# Patient Record
Sex: Female | Born: 1960
Health system: Southern US, Community
[De-identification: ages and names within clinical notes are randomized; demographics above are authoritative.]

## PROBLEM LIST (undated history)

## (undated) DIAGNOSIS — B019 Varicella without complication: Secondary | ICD-10-CM

## (undated) DIAGNOSIS — J45990 Exercise induced bronchospasm: Secondary | ICD-10-CM

## (undated) DIAGNOSIS — J45909 Unspecified asthma, uncomplicated: Secondary | ICD-10-CM

## (undated) HISTORY — DX: Varicella without complication: B01.9

## (undated) HISTORY — PX: CHOLECYSTECTOMY: SHX55

## (undated) HISTORY — DX: Exercise induced bronchospasm: J45.990

## (undated) HISTORY — DX: Unspecified asthma, uncomplicated: J45.909

---

## 1982-10-17 HISTORY — PX: WISDOM TOOTH EXTRACTION: SHX21

## 1994-10-17 HISTORY — PX: CHOLECYSTECTOMY: SHX55

## 2011-08-20 ENCOUNTER — Encounter: Payer: Self-pay | Admitting: Emergency Medicine

## 2011-08-20 ENCOUNTER — Emergency Department (HOSPITAL_BASED_OUTPATIENT_CLINIC_OR_DEPARTMENT_OTHER)
Admission: EM | Admit: 2011-08-20 | Discharge: 2011-08-21 | Disposition: A | Discharge status: Home / Self Care | Payer: BC Managed Care – PPO | Attending: Emergency Medicine | Admitting: Emergency Medicine

## 2011-08-20 DIAGNOSIS — R21 Rash and other nonspecific skin eruption: Secondary | ICD-10-CM | POA: Insufficient documentation

## 2011-08-20 DIAGNOSIS — R55 Syncope and collapse: Secondary | ICD-10-CM

## 2011-08-20 DIAGNOSIS — R51 Headache: Secondary | ICD-10-CM | POA: Insufficient documentation

## 2011-08-20 MED ORDER — SODIUM CHLORIDE 0.9 % IV BOLUS (SEPSIS)
1000.0000 mL | Freq: Once | INTRAVENOUS | Status: AC
  Administered 2011-08-21: 1000 mL via INTRAVENOUS

## 2011-08-20 MED ORDER — METHYLPREDNISOLONE SODIUM SUCC 125 MG IJ SOLR
125.0000 mg | Freq: Once | INTRAMUSCULAR | Status: AC
  Administered 2011-08-21: 125 mg via INTRAVENOUS
  Filled 2011-08-20: qty 2

## 2011-08-20 MED ORDER — DIPHENHYDRAMINE HCL 50 MG/ML IJ SOLN
25.0000 mg | Freq: Once | INTRAMUSCULAR | Status: AC
  Administered 2011-08-21: 25 mg via INTRAVENOUS
  Filled 2011-08-20: qty 1

## 2011-08-20 MED ORDER — FAMOTIDINE IN NACL 20-0.9 MG/50ML-% IV SOLN
20.0000 mg | Freq: Once | INTRAVENOUS | Status: AC
  Administered 2011-08-21: 20 mg via INTRAVENOUS
  Filled 2011-08-20: qty 50

## 2011-08-20 NOTE — ED Notes (Signed)
ECG done and given to EDP 

## 2011-08-20 NOTE — ED Notes (Signed)
Pt to ED via EMS w c/o syncopal episode per husband - pt reports she had tuna around 6:30 p & noticed BUE, trunk & back became "red"- went to bed, then became syncopal when she tried to get up.

## 2011-08-20 NOTE — ED Notes (Signed)
ZOFRAN 4MG  IV & BENADRYL 25 MG PO GIVEN BY EMS

## 2011-08-20 NOTE — ED Provider Notes (Signed)
Scribed for Forbes Cellar, MD, the patient was seen in room MH10/MH10. This chart was scribed by [employer]. The patient's care started at 23:04  CSN: [id number] Arrival date & time: 08/20/2011 10:26 PM   First MD Initiated Contact with Patient 08/20/11 2304      Chief Complaint  Patient presents with  . Loss of Consciousness   HPI Rachael Nelson is a 50 y.o. female brought in by ambulance who presents to the Emergency Department complaining of Loss of Consciousness. Patient reports that she had bright red blotches on her skin while having a tuna steak tonight at 6:30pm. She states "I didn't feel right". She went home immediately and felt very dizzy/light headed and began to have sudden onset diffuse headache. Patient reports that she tried standing up from bed, felt short of breath, felt chills, sat back down then passed out. Husband reports 1-2 min LOC. No shaking or tongue biting/incontinence. Husbands states that she was pale at time of syncope but that before and since that time he has notice progressively worsening skin redness. Denies neck stiffness.  Patient states that she is not currently short of breath, but reports "heavy palpitations in her heart" that feels like her heart is pounding. She did have these sx during EKG taken today. She continues to c/o diffuse headache. Patient denies itching.  Patient is a non-smoker and does not use alcohol. She reports current chills and mild nausea. Denies fever. Patient was given some Benadryl by EMS enroute the ER. Denies a history of cancer, her father has VTE but had known inciting factor (surgery), pt denies recent trip/travel/surg/hosp. No h/o seafood allergies  History reviewed. No pertinent past medical history.  Past Surgical History  Procedure Date  . Cesarean section   . Cholecystectomy     No family history on file.  History  Substance Use Topics  . Smoking status: Never Smoker   . Smokeless tobacco: Not on file  . Alcohol  Use: No    OB History    Grav Para Term Preterm Abortions TAB SAB Ect Mult Living                  Review of Systems  Respiratory: Positive for shortness of breath (currently resolved).   Skin: Positive for rash.  Neurological: Positive for syncope.  All other systems reviewed and are negative.    Allergies  Review of patient's allergies indicates no known allergies.  Home Medications   Current Outpatient Rx  Name Route Sig Dispense Refill  . CALCIUM + D PO Oral Take 1 tablet by mouth daily.      . OSTEO BI-FLEX ADV TRIPLE ST PO TABS Oral Take 1 tablet by mouth daily.      Carma Leaven M PLUS PO TABS Oral Take 1 tablet by mouth daily.      Marland Kitchen VITAMIN D (CHOLECALCIFEROL) PO Oral Take 1 tablet by mouth daily.        BP 108/67  Pulse 67  Temp 97.8 F (36.6 C)  Resp 18  SpO2 96%  LMP 08/08/2011  Physical Exam  Nursing note and vitals reviewed. Constitutional: She is oriented to person, place, and time. She appears well-developed and well-nourished. No distress.  HENT:  Head: Normocephalic and atraumatic.  Mouth/Throat: Oropharynx is clear and moist. No oropharyngeal exudate.  Eyes: Conjunctivae are normal. Right eye exhibits no discharge. Left eye exhibits no discharge.  Neck: Neck supple. No tracheal deviation present.       No meningismus  Cardiovascular: Normal rate.   No murmur heard. Pulmonary/Chest: Effort normal. No respiratory distress. She has no wheezes. She has no rales. She exhibits no tenderness.  Abdominal: Soft. There is no tenderness.  Musculoskeletal: Normal range of motion. She exhibits no edema and no tenderness.  Neurological: She is alert and oriented to person, place, and time. No cranial nerve deficit.  Skin: Skin is warm and dry. Rash (Diffuse macular rash that blanches with palpation. Rash is erythematous. Rash is evident  diffusely on her abdomen,  thoracic area, face and lumbar back) noted. She is not diaphoretic.  Psychiatric: She has a normal  mood and affect. Her behavior is normal.    ED Course  Procedures DIAGNOSTIC STUDIES: Oxygen Saturation is 96% on room air, normal by my interpretation.    COORDINATION OF CARE: 24:15 - EDP examined patient at bedside and ordered the following Orders Placed This Encounter  Procedures  . CT Head Wo Contrast  . CBC  . Differential  . Comprehensive metabolic panel  . Cardiac panel(cret kin+cktot+mb+tropi)  . D-dimer, quantitative     Date: 08/21/2011  Rate: 72  Rhythm: normal sinus rhythm  QRS Axis: normal  Intervals: normal  ST/T Wave abnormalities: normal  Conduction Disutrbances:none  Narrative Interpretation:   Old EKG Reviewed: none available    MDM: 50 year-old female previously healthy presents with multiple complaints including syncope, rash, headache after eating tuna. DDx incl scobroid (urticarial appearing rash but not pruritic), PE, SAH, vasovagal syncope. Labs unremarkable. Rash resolved with IVF, benadryl, decadron, pepcid. Pt did continue to c/o headache and CT head was subsequently ordered.   Scribe Attestation I personally performed the services described in this documentation, which was scribed in my presence. The recorded information has been reviewed and considered.  4:08 AM  Discussed results of CT head with patient. She is feeling completely back to baseline now. Headache 0/10. She is declining lumbar puncture. She is aware that resolution of symptoms after morphine does not indicate that [facility] is not present. Discussed extensively risks of leaving without LP and she is aware and still refusing. Will not sign out ama. Pt aware that she can return at any time.  Stefano Gaul, MD   Forbes Cellar, MD 08/21/11 [phone]

## 2011-08-21 ENCOUNTER — Emergency Department (INDEPENDENT_AMBULATORY_CARE_PROVIDER_SITE_OTHER): Payer: BC Managed Care – PPO

## 2011-08-21 DIAGNOSIS — R55 Syncope and collapse: Secondary | ICD-10-CM

## 2011-08-21 DIAGNOSIS — G319 Degenerative disease of nervous system, unspecified: Secondary | ICD-10-CM

## 2011-08-21 DIAGNOSIS — R51 Headache: Secondary | ICD-10-CM

## 2011-08-21 LAB — DIFFERENTIAL
Basophils Relative: 0 % (ref 0–1)
Eosinophils Absolute: 0 10*3/uL (ref 0.0–0.7)
Monocytes Relative: 4 % (ref 3–12)
Neutrophils Relative %: 85 % — ABNORMAL HIGH (ref 43–77)

## 2011-08-21 LAB — COMPREHENSIVE METABOLIC PANEL
Albumin: 3.6 g/dL (ref 3.5–5.2)
Alkaline Phosphatase: 62 U/L (ref 39–117)
BUN: 21 mg/dL (ref 6–23)
Potassium: 4 mEq/L (ref 3.5–5.1)
Sodium: 138 mEq/L (ref 135–145)
Total Protein: 6.8 g/dL (ref 6.0–8.3)

## 2011-08-21 LAB — CARDIAC PANEL(CRET KIN+CKTOT+MB+TROPI)
Relative Index: INVALID (ref 0.0–2.5)
Total CK: 55 U/L (ref 7–177)

## 2011-08-21 LAB — D-DIMER, QUANTITATIVE: D-Dimer, Quant: <0.22 ug/mL-FEU (ref 0.00–0.48)

## 2011-08-21 LAB — CBC
MCH: 29.6 pg (ref 26.0–34.0)
MCHC: 34.8 g/dL (ref 30.0–36.0)
Platelets: 293 10*3/uL (ref 150–400)
RDW: 12.6 % (ref 11.5–15.5)

## 2011-08-21 MED ORDER — MORPHINE SULFATE 4 MG/ML IJ SOLN
INTRAMUSCULAR | Status: AC
  Administered 2011-08-21: 4 mg via INTRAVENOUS
  Filled 2011-08-21: qty 1

## 2011-08-21 NOTE — ED Notes (Signed)
Pt given d/c instructions- caox4. States headache is gone, rates pain 0/10. No rx given. Husband present to drive

## 2011-08-21 NOTE — ED Notes (Signed)
Pt alert and orieted- states pain relieved after morphine administration. Has called her husband for ride home pending d/c paperwork

## 2014-10-17 HISTORY — PX: BREAST BIOPSY: SHX20

## 2015-09-23 ENCOUNTER — Other Ambulatory Visit: Payer: Self-pay | Admitting: Obstetrics and Gynecology

## 2015-09-23 DIAGNOSIS — R928 Other abnormal and inconclusive findings on diagnostic imaging of breast: Secondary | ICD-10-CM

## 2015-09-29 ENCOUNTER — Other Ambulatory Visit: Payer: Self-pay | Admitting: Obstetrics and Gynecology

## 2015-09-29 ENCOUNTER — Ambulatory Visit
Admission: RE | Admit: 2015-09-29 | Discharge: 2015-09-29 | Disposition: A | Discharge status: Home / Self Care | Payer: BLUE CROSS/BLUE SHIELD | Source: Ambulatory Visit | Attending: Obstetrics and Gynecology | Admitting: Obstetrics and Gynecology

## 2015-09-29 DIAGNOSIS — R928 Other abnormal and inconclusive findings on diagnostic imaging of breast: Secondary | ICD-10-CM

## 2015-10-05 ENCOUNTER — Ambulatory Visit
Admission: RE | Admit: 2015-10-05 | Discharge: 2015-10-05 | Disposition: A | Discharge status: Home / Self Care | Payer: BLUE CROSS/BLUE SHIELD | Source: Ambulatory Visit | Attending: Obstetrics and Gynecology | Admitting: Obstetrics and Gynecology

## 2015-10-05 ENCOUNTER — Other Ambulatory Visit: Payer: Self-pay | Admitting: Obstetrics and Gynecology

## 2015-10-05 DIAGNOSIS — R928 Other abnormal and inconclusive findings on diagnostic imaging of breast: Secondary | ICD-10-CM

## 2015-12-03 LAB — COMPREHENSIVE METABOLIC PANEL
Albumin: 4.1 (ref 3.5–5.0)
Calcium: 10.1 (ref 8.7–10.7)
GFR calc Af Amer: 114
GFR calc non Af Amer: 99

## 2015-12-03 LAB — BASIC METABOLIC PANEL
BUN: 24 — AB (ref 4–21)
CO2: 25 — AB (ref 13–22)
Chloride: 101 (ref 99–108)
Creatinine: 0.7 (ref 0.5–1.1)
Glucose: 83
Potassium: 4.4 (ref 3.4–5.3)
Sodium: 146 (ref 137–147)

## 2015-12-03 LAB — HEPATIC FUNCTION PANEL
ALT: 17 (ref 7–35)
AST: 23 (ref 13–35)
Alkaline Phosphatase: 71 (ref 25–125)
Bilirubin, Direct: 0.09 (ref 0.01–0.4)
Bilirubin, Total: 0.3

## 2015-12-03 LAB — CBC AND DIFFERENTIAL
HCT: 44 (ref 36–46)
Hemoglobin: 14.6 (ref 12.0–16.0)
Platelets: 289 (ref 150–399)
WBC: 6.3

## 2015-12-03 LAB — CBC: RBC: 5.04 (ref 3.87–5.11)

## 2016-02-18 DIAGNOSIS — Z713 Dietary counseling and surveillance: Secondary | ICD-10-CM | POA: Diagnosis not present

## 2016-04-01 DIAGNOSIS — Z1211 Encounter for screening for malignant neoplasm of colon: Secondary | ICD-10-CM | POA: Diagnosis not present

## 2016-04-01 LAB — HM COLONOSCOPY

## 2016-04-28 DIAGNOSIS — Z713 Dietary counseling and surveillance: Secondary | ICD-10-CM | POA: Diagnosis not present

## 2016-07-07 DIAGNOSIS — Z713 Dietary counseling and surveillance: Secondary | ICD-10-CM | POA: Diagnosis not present

## 2016-08-03 DIAGNOSIS — Z23 Encounter for immunization: Secondary | ICD-10-CM | POA: Diagnosis not present

## 2016-08-23 DIAGNOSIS — Z713 Dietary counseling and surveillance: Secondary | ICD-10-CM | POA: Diagnosis not present

## 2016-09-05 DIAGNOSIS — Z13820 Encounter for screening for osteoporosis: Secondary | ICD-10-CM | POA: Diagnosis not present

## 2016-10-26 DIAGNOSIS — Z01419 Encounter for gynecological examination (general) (routine) without abnormal findings: Secondary | ICD-10-CM | POA: Diagnosis not present

## 2016-10-26 DIAGNOSIS — Z30432 Encounter for removal of intrauterine contraceptive device: Secondary | ICD-10-CM | POA: Diagnosis not present

## 2016-10-26 DIAGNOSIS — Z1231 Encounter for screening mammogram for malignant neoplasm of breast: Secondary | ICD-10-CM | POA: Diagnosis not present

## 2016-10-26 DIAGNOSIS — Z6838 Body mass index (BMI) 38.0-38.9, adult: Secondary | ICD-10-CM | POA: Diagnosis not present

## 2016-10-27 ENCOUNTER — Other Ambulatory Visit: Payer: Self-pay | Admitting: Obstetrics and Gynecology

## 2016-10-27 DIAGNOSIS — Z713 Dietary counseling and surveillance: Secondary | ICD-10-CM | POA: Diagnosis not present

## 2016-10-27 DIAGNOSIS — R928 Other abnormal and inconclusive findings on diagnostic imaging of breast: Secondary | ICD-10-CM

## 2016-11-01 IMAGING — MG MM DIAGNOSTIC UNILATERAL L
2 series · 2 of 2 positions shown · non-contrast
Comparison: Previous exam(s).

CLINICAL DATA: Status post ultrasound-guided core needle biopsy of
a 6 mm mass in the 4 o'clock position of the left breast.

EXAM:
DIAGNOSTIC LEFT MAMMOGRAM POST ULTRASOUND BIOPSY

[L CC]
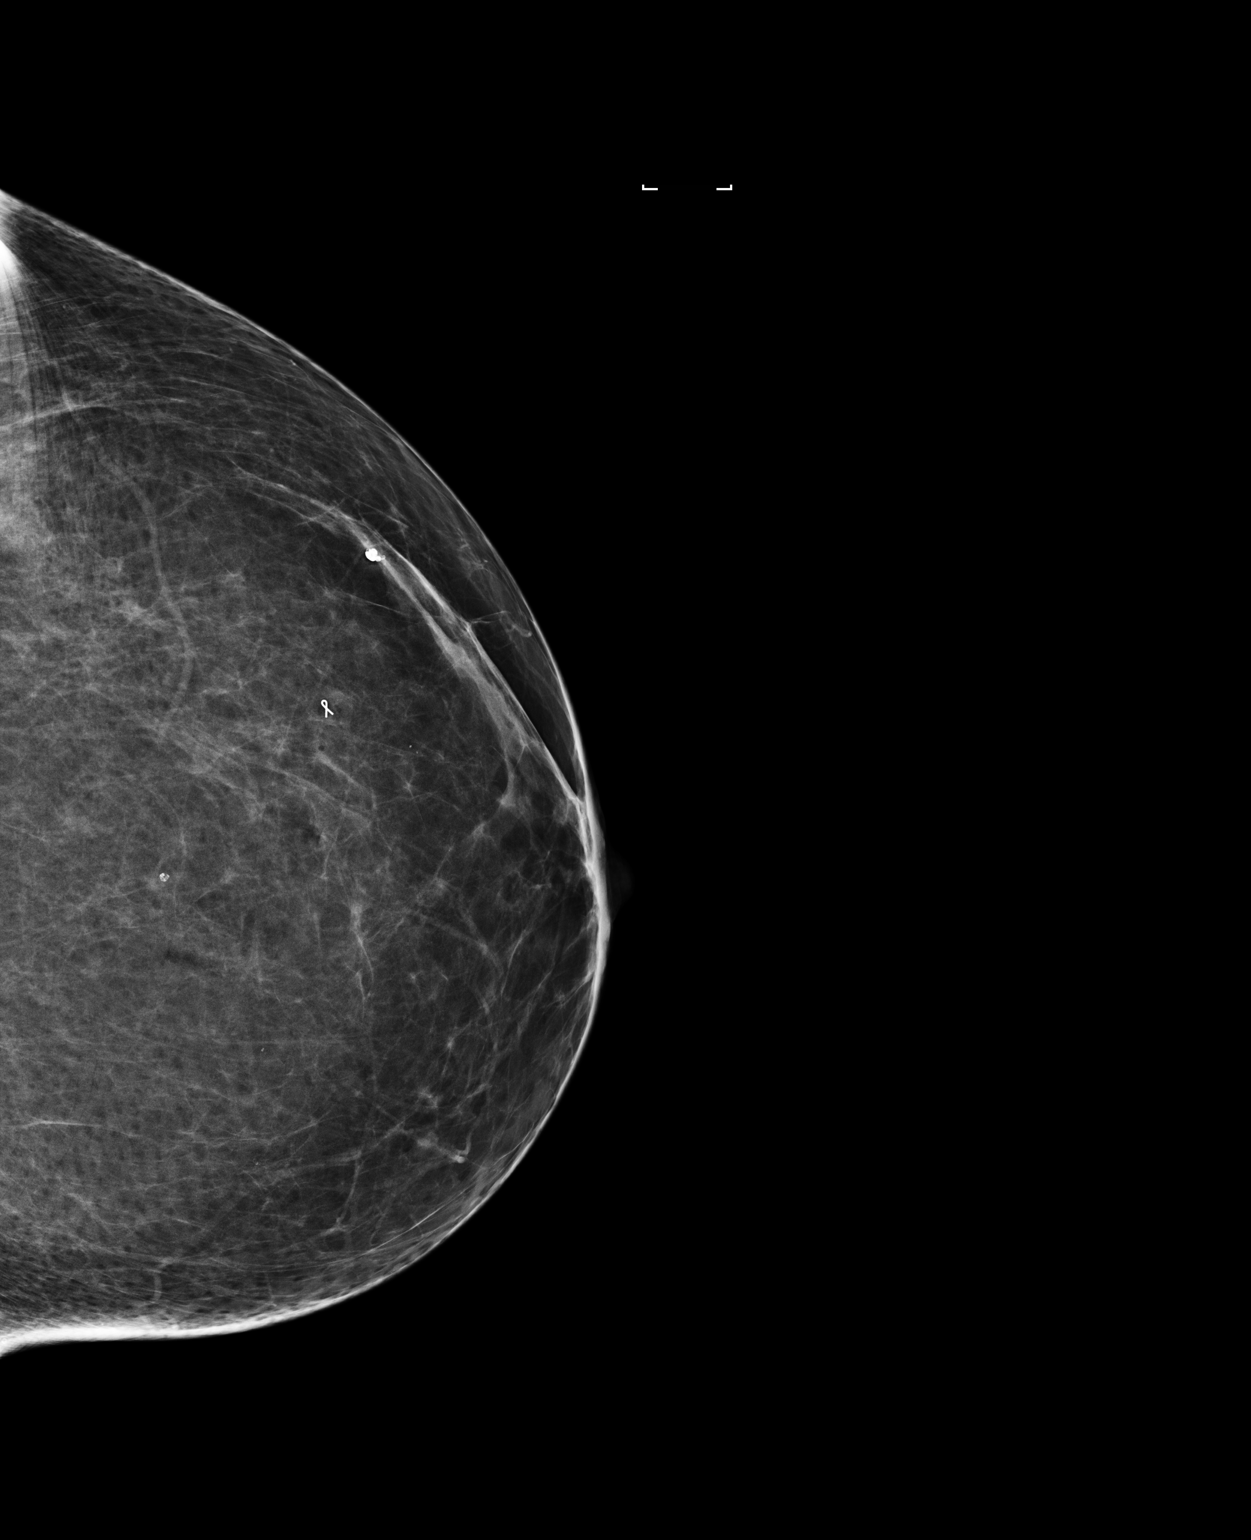

[L ML]
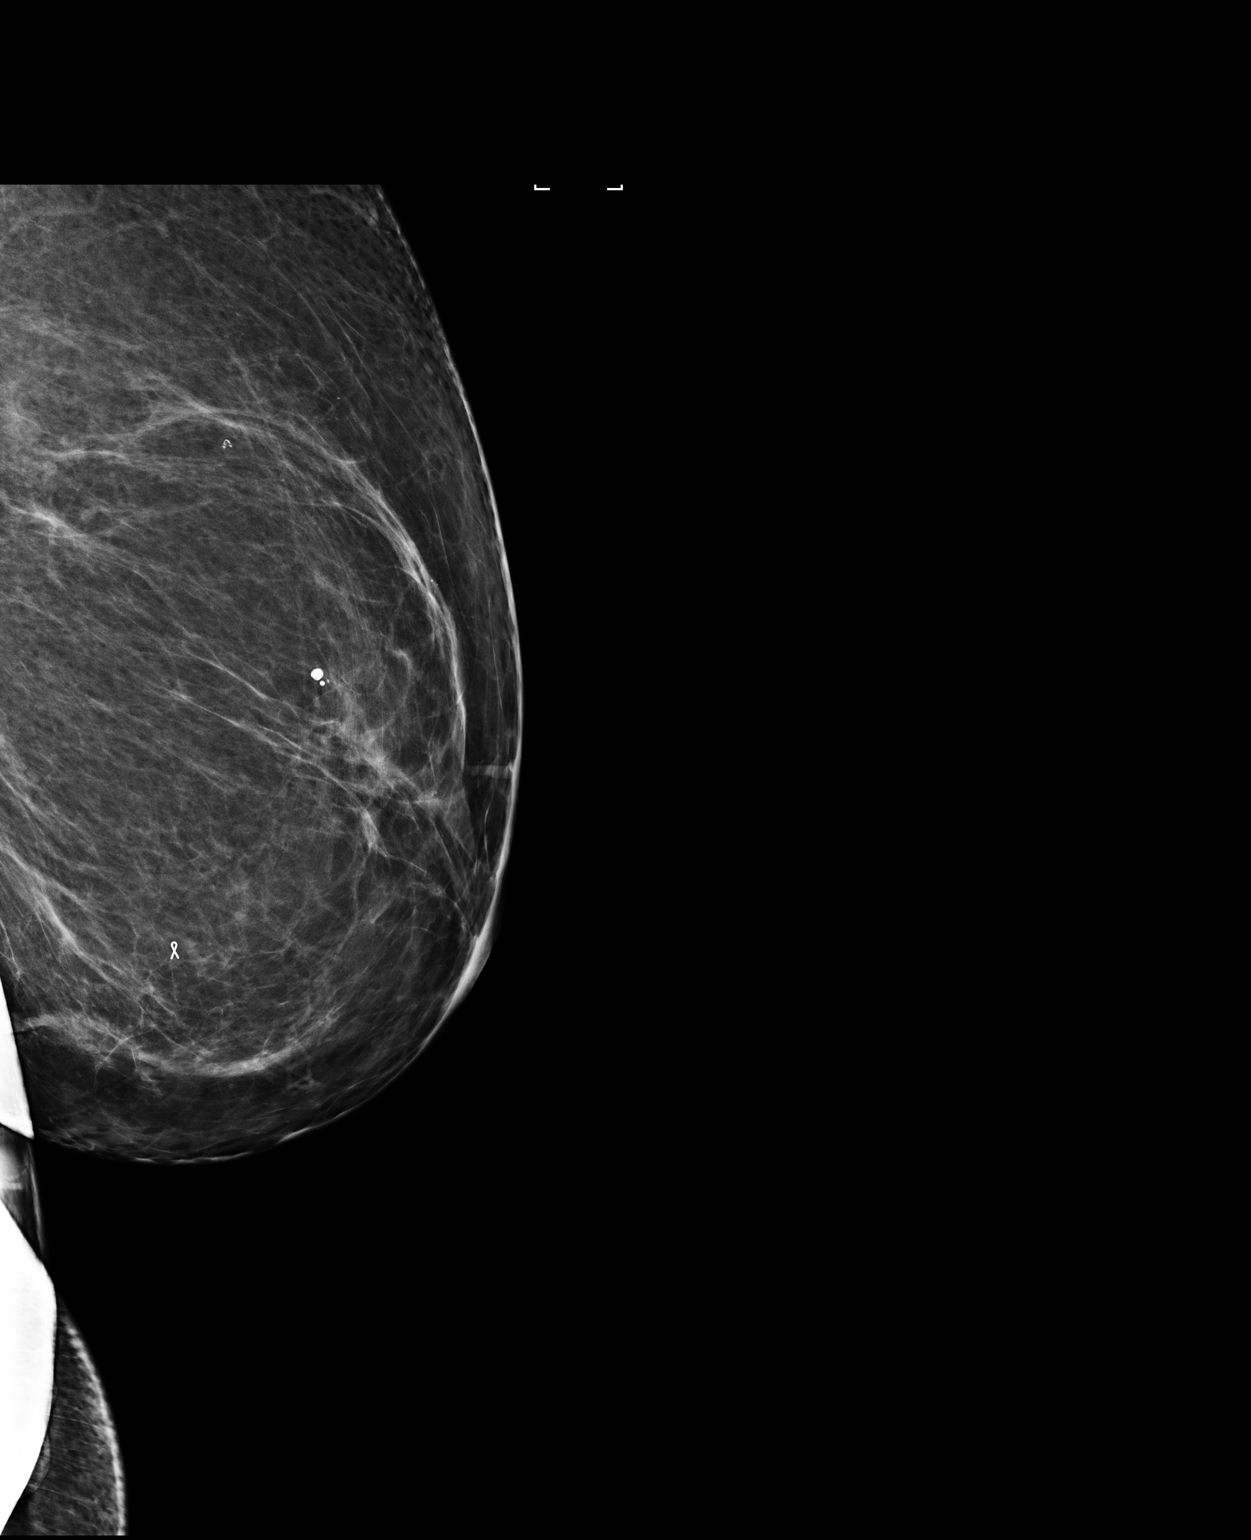

[2 of 2 positions shown; findings below may reference images not displayed]

FINDINGS: Mammographic images were obtained following ultrasound guided biopsy
of the recently demonstrated 6 mm mass in the 4 o'clock position of
the left breast, 4 cm from the nipple. These demonstrate a ribbon
shaped biopsy marker clip at the location of the biopsied mass.
IMPRESSION: Appropriate clip deployment following left breast ultrasound-guided
core needle biopsy.

Final Assessment: Post Procedure Mammograms for Marker Placement

## 2016-11-07 ENCOUNTER — Ambulatory Visit
Admission: RE | Admit: 2016-11-07 | Discharge: 2016-11-07 | Disposition: A | Discharge status: Home / Self Care | Payer: BLUE CROSS/BLUE SHIELD | Source: Ambulatory Visit | Attending: Obstetrics and Gynecology | Admitting: Obstetrics and Gynecology

## 2016-11-07 DIAGNOSIS — R928 Other abnormal and inconclusive findings on diagnostic imaging of breast: Secondary | ICD-10-CM

## 2016-11-07 DIAGNOSIS — N6311 Unspecified lump in the right breast, upper outer quadrant: Secondary | ICD-10-CM | POA: Diagnosis not present

## 2016-12-22 DIAGNOSIS — Z713 Dietary counseling and surveillance: Secondary | ICD-10-CM | POA: Diagnosis not present

## 2017-01-12 DIAGNOSIS — H5213 Myopia, bilateral: Secondary | ICD-10-CM | POA: Diagnosis not present

## 2017-03-02 DIAGNOSIS — Z713 Dietary counseling and surveillance: Secondary | ICD-10-CM | POA: Diagnosis not present

## 2017-04-10 DIAGNOSIS — J45990 Exercise induced bronchospasm: Secondary | ICD-10-CM | POA: Diagnosis not present

## 2017-05-01 DIAGNOSIS — L918 Other hypertrophic disorders of the skin: Secondary | ICD-10-CM | POA: Diagnosis not present

## 2017-05-11 DIAGNOSIS — Z713 Dietary counseling and surveillance: Secondary | ICD-10-CM | POA: Diagnosis not present

## 2017-07-11 DIAGNOSIS — Z713 Dietary counseling and surveillance: Secondary | ICD-10-CM | POA: Diagnosis not present

## 2017-08-02 DIAGNOSIS — Z23 Encounter for immunization: Secondary | ICD-10-CM | POA: Diagnosis not present

## 2017-12-05 IMAGING — US ULTRASOUND RIGHT BREAST LIMITED
1 series · 6 of 6 positions shown · non-contrast
Comparison: Previous exam(s).

CLINICAL DATA: Screening recall for a possible right breast mass.

EXAM:
2D DIGITAL DIAGNOSTIC UNILATERAL RIGHT MAMMOGRAM WITH CAD AND
ADJUNCT TOMO
RIGHT BREAST ULTRASOUND

[Series 1: ultrasound right breast limited · 0.08mm/px · 6 of 6 slices shown]
[im 1/6]
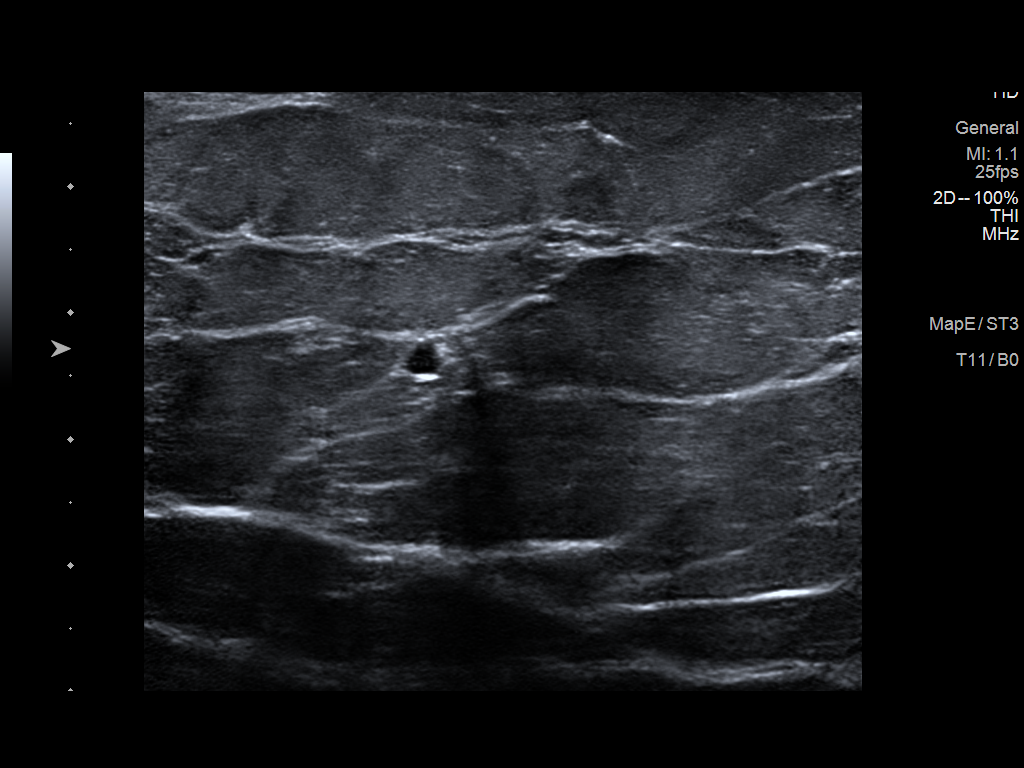
[im 2/6]
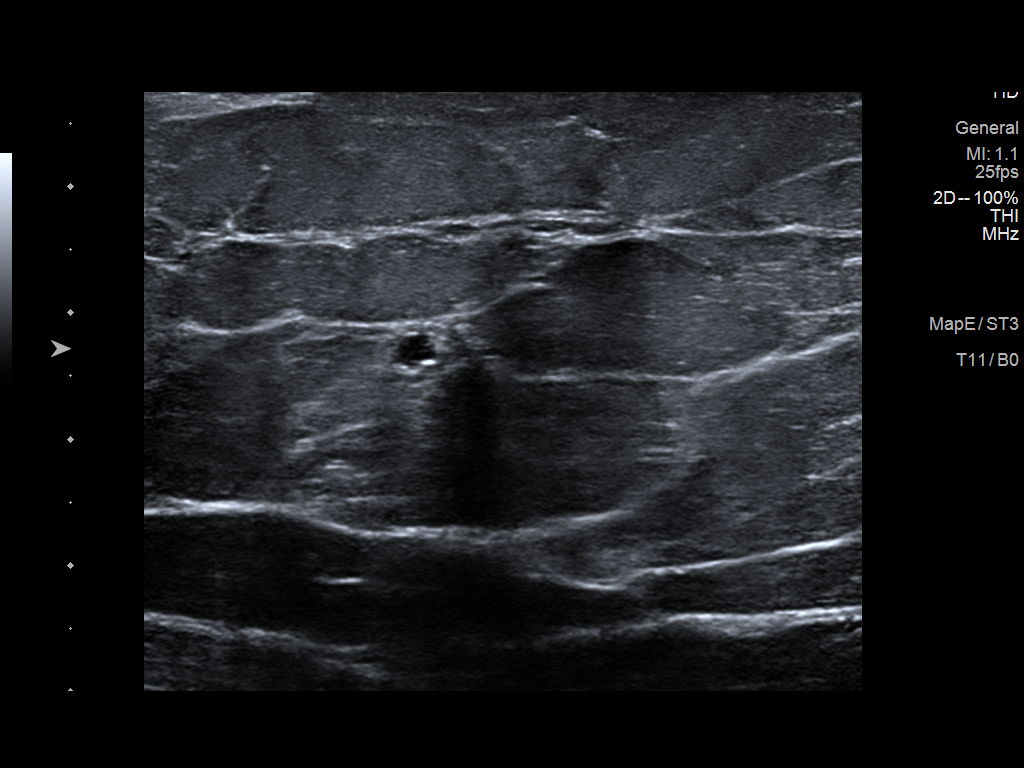
[im 3/6]
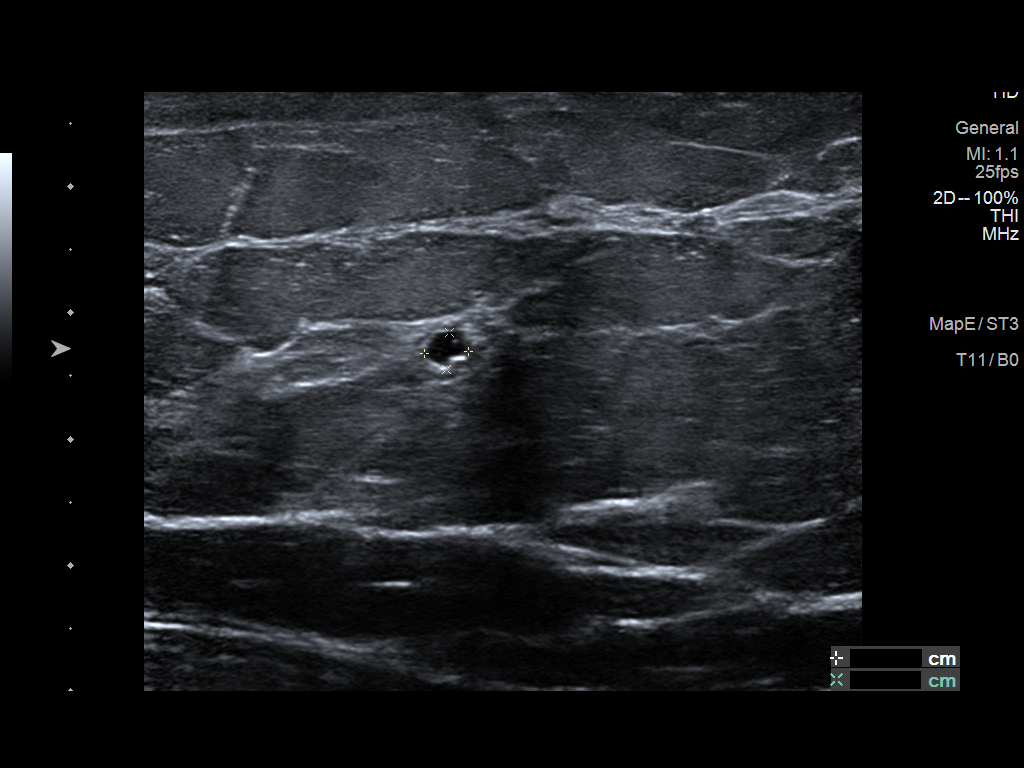
[im 4/6]
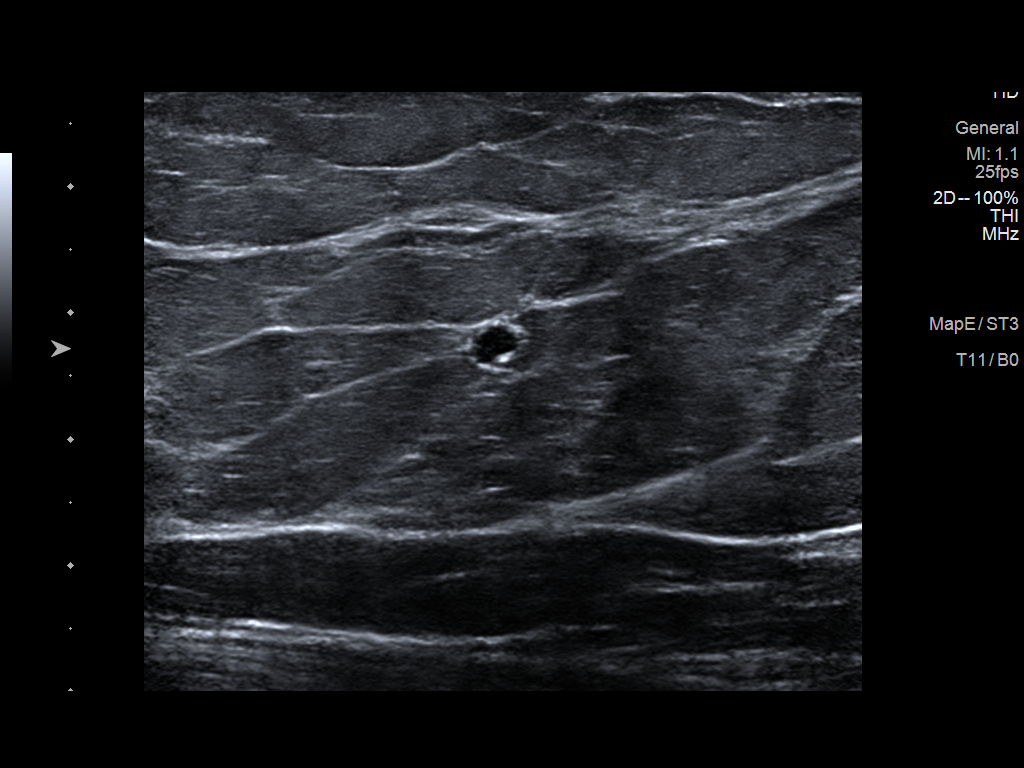
[im 5/6]
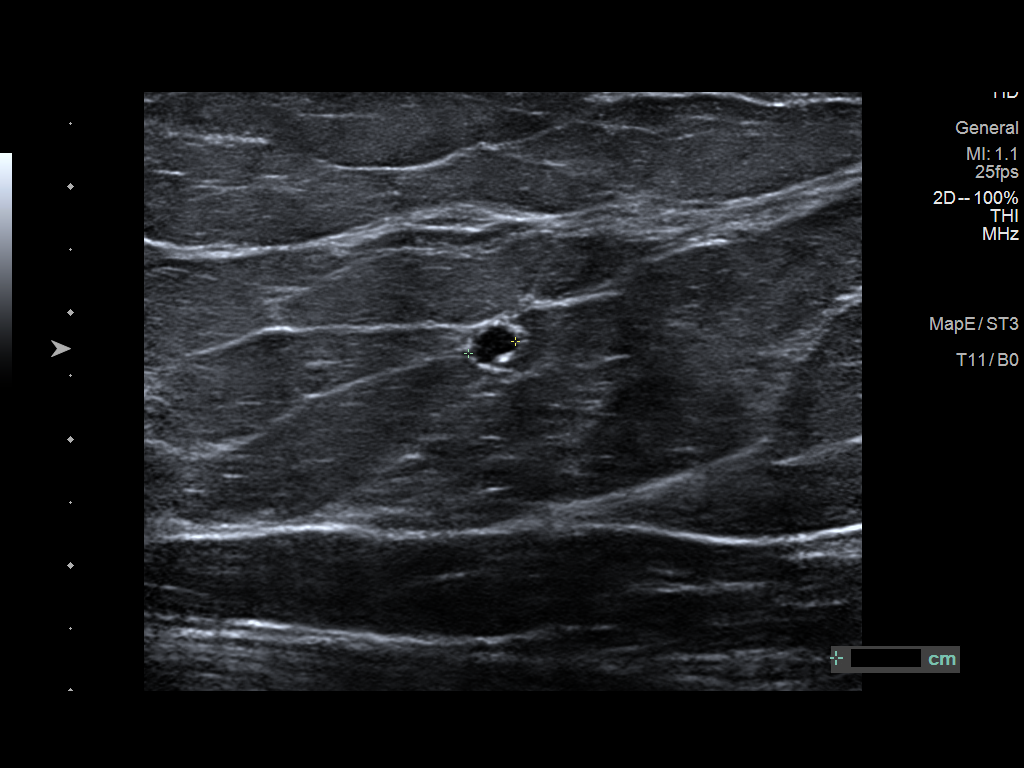
[im 6/6]
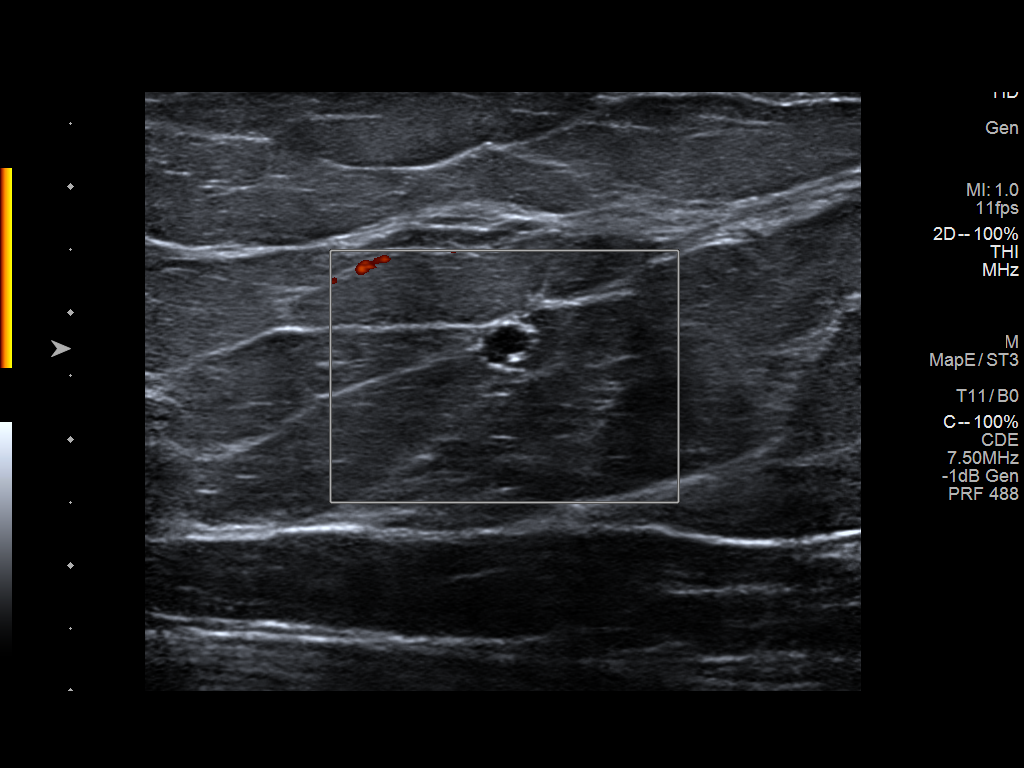

[6 of 6 positions shown; findings below may reference images not displayed]

ACR Breast Density Category b: There are scattered areas of
fibroglandular density.
FINDINGS: In the lateral aspect of the right breast, middle depth there is a 4
mm circumscribed oval mass new from the prior exam.

Mammographic images were processed with CAD.

Ultrasound of the right breast at 9 o'clock, 5 cm from the nipple
demonstrates a circumscribed anechoic oval mass measuring 4 x 3 x 4
mm. This corresponds in size and location to the mammographic mass
of concern.
IMPRESSION: The mass in the right breast identified on the screening mammogram
corresponds with a benign cyst.

RECOMMENDATION:
Screening mammogram in one year.(Code:IB-B-W3X)

I have discussed the findings and recommendations with the patient.
Results were also provided in writing at the conclusion of the
visit. If applicable, a reminder letter will be sent to the patient
regarding the next appointment.

BI-RADS CATEGORY  2: Benign.

## 2018-01-03 DIAGNOSIS — H5213 Myopia, bilateral: Secondary | ICD-10-CM | POA: Diagnosis not present

## 2018-08-07 DIAGNOSIS — Z23 Encounter for immunization: Secondary | ICD-10-CM | POA: Diagnosis not present

## 2019-02-26 DIAGNOSIS — Z6839 Body mass index (BMI) 39.0-39.9, adult: Secondary | ICD-10-CM | POA: Diagnosis not present

## 2019-02-26 DIAGNOSIS — Z1231 Encounter for screening mammogram for malignant neoplasm of breast: Secondary | ICD-10-CM | POA: Diagnosis not present

## 2019-02-26 DIAGNOSIS — Z01419 Encounter for gynecological examination (general) (routine) without abnormal findings: Secondary | ICD-10-CM | POA: Diagnosis not present

## 2019-02-26 LAB — HM MAMMOGRAPHY

## 2019-02-26 LAB — CBC AND DIFFERENTIAL: Hemoglobin: 11 — AB (ref 12.0–16.0)

## 2019-02-26 LAB — HM PAP SMEAR: HM Pap smear: NORMAL

## 2019-03-04 LAB — HM PAP SMEAR

## 2019-05-16 ENCOUNTER — Encounter: Payer: Self-pay | Admitting: Family Medicine

## 2019-05-16 DIAGNOSIS — J45990 Exercise induced bronchospasm: Secondary | ICD-10-CM | POA: Insufficient documentation

## 2019-05-16 DIAGNOSIS — B019 Varicella without complication: Secondary | ICD-10-CM | POA: Insufficient documentation

## 2019-05-16 DIAGNOSIS — H43812 Vitreous degeneration, left eye: Secondary | ICD-10-CM | POA: Diagnosis not present

## 2019-05-17 ENCOUNTER — Ambulatory Visit (INDEPENDENT_AMBULATORY_CARE_PROVIDER_SITE_OTHER): Payer: BC Managed Care – PPO | Admitting: Family Medicine

## 2019-05-17 ENCOUNTER — Other Ambulatory Visit: Payer: Self-pay

## 2019-05-17 ENCOUNTER — Encounter: Payer: Self-pay | Admitting: Family Medicine

## 2019-05-17 VITALS — BP 125/80 | HR 70 | Temp 98.5°F | Resp 17 | Ht 61.0 in | Wt 201.4 lb

## 2019-05-17 DIAGNOSIS — Z0000 Encounter for general adult medical examination without abnormal findings: Secondary | ICD-10-CM

## 2019-05-17 DIAGNOSIS — E669 Obesity, unspecified: Secondary | ICD-10-CM | POA: Diagnosis not present

## 2019-05-17 DIAGNOSIS — Z1159 Encounter for screening for other viral diseases: Secondary | ICD-10-CM

## 2019-05-17 DIAGNOSIS — Z114 Encounter for screening for human immunodeficiency virus [HIV]: Secondary | ICD-10-CM

## 2019-05-17 DIAGNOSIS — Z79899 Other long term (current) drug therapy: Secondary | ICD-10-CM

## 2019-05-17 DIAGNOSIS — Z131 Encounter for screening for diabetes mellitus: Secondary | ICD-10-CM | POA: Diagnosis not present

## 2019-05-17 DIAGNOSIS — Z23 Encounter for immunization: Secondary | ICD-10-CM

## 2019-05-17 DIAGNOSIS — J45990 Exercise induced bronchospasm: Secondary | ICD-10-CM

## 2019-05-17 DIAGNOSIS — Z130 Encounter for screening for diseases of the blood and blood-forming organs and certain disorders involving the immune mechanism: Secondary | ICD-10-CM

## 2019-05-17 MED ORDER — ALBUTEROL SULFATE HFA 108 (90 BASE) MCG/ACT IN AERS: 1.0000 | INHALATION_SPRAY | Freq: Four times a day (QID) | RESPIRATORY_TRACT | 5 refills | Status: DC | PRN

## 2019-05-17 NOTE — Progress Notes (Signed)
Patient ID: Rachael Nelson, female  DOB: 1961/07/26, 58 y.o.   MRN: [id number] Patient Care Team    Relationship Specialty Notifications Start End  Rachael Hillock, DO PCP - General Family Medicine  05/17/19   Rachael Pearson, MD Consulting Physician Obstetrics and Gynecology  05/17/19     Chief Complaint  Patient presents with  . Establish Care    [facility] prior PCP. [facility], Last pap and mammo, May 2020.  Needs CPE.     Subjective:  Rachael Nelson is a 58 y.o.  female present for new patient establishment/cpe. All past medical history, surgical history, allergies, family history, immunizations, medications and social history were updated in the electronic medical record today. All recent labs, ED visits and hospitalizations within the last year were reviewed.  Health maintenance:  Colonoscopy: completed 2018, by ? , resutls normal. follow up 10. Mammogram: completed:02/2019, [facility] Cervical cancer screening: last pap: 02/2019, [facility] Immunizations: tdap Due- nurse visit 4 weeks , Influenza  (encouraged yearly),  shingrix start today #1 and nurse visit 2-6 mos #2 Infectious disease screening: HIV and Hep C collected today- pt desires screens.  DEXA: routine screen 60-65 Assistive device: none Oxygen GYI:RSWN Patient has a Dental home. Hospitalizations/ED visits:  Depression screen [facility] 2/9 05/17/2019  Decreased Interest 0  Down, Depressed, Hopeless 0  PHQ - 2 Score 0   No flowsheet data found.    No flowsheet data found.  Immunization History  Administered Date(s) Administered  . Zoster Recombinat (Shingrix) 05/17/2019    No exam data present  Past Medical History:  Diagnosis Date  . Asthma   . Chicken pox   . Exercise-induced asthma    No Known Allergies Past Surgical History:  Procedure Laterality Date  . BREAST BIOPSY Left 2016   benign  . CESAREAN SECTION  1999  . CHOLECYSTECTOMY  1996  . WISDOM TOOTH EXTRACTION  1984   Family History   Problem Relation Age of Onset  . Arthritis Mother   . Diabetes Mother   . Hypertension Mother   . Heart disease Father   . Hypertension Father   . Diabetes Maternal Grandmother   . Stroke Maternal Grandmother   . Heart disease Maternal Grandfather   . Stroke Paternal Grandmother   . Early death Paternal Grandfather    Social History   Social History Narrative   Marital status/children/pets: Divorced.  Has twin sons in college.   Education/employment: Masters degree, works as a Therapist, music:      -Wears a bicycle helmet riding a bike: Yes     -smoke alarm in the home:Yes     - wears seatbelt: Yes       Allergies as of 05/17/2019   No Known Allergies     Medication List       Accurate as of May 17, 2019  5:38 PM. If you have any questions, ask your nurse or doctor.        albuterol 108 (90 Base) MCG/ACT inhaler Commonly known as: VENTOLIN HFA Inhale 1-2 puffs into the lungs every 6 (six) hours as needed for wheezing or shortness of breath. What changed: how much to take Changed by: Howard Pouch, DO   CALCIUM + D PO Take 1 tablet by mouth daily. 2,000 units Vit D , 526m Vit C   glucosamine-chondroitin 500-400 MG tablet Take 1 tablet by mouth 3 (three) times daily.   multivitamins ther. w/minerals Tabs tablet Take  1 tablet by mouth daily.   Osteo Bi-Flex Adv Triple St Tabs Take 1 tablet by mouth daily.   VITAMIN D (CHOLECALCIFEROL) PO Take 1 tablet by mouth daily.      All past medical history, surgical history, allergies, family history, immunizations andmedications were updated in the EMR today and reviewed under the history and medication portions of their EMR.    No results found for this or any previous visit (from the past 2160 hour(s)).   ROS: 14 pt review of systems performed and negative (unless mentioned in an HPI)  Objective: BP 125/80 (BP Location: Right Arm, Patient Position: Sitting, Cuff Size: Normal)   Pulse 70   Temp 98.5 F  (36.9 C) (Temporal)   Resp 17   Ht _0  (1.549 m)   Wt 201 lb 6 oz (91.3 kg)   LMP 08/08/2011   SpO2 98%   BMI 38.05 kg/m  Gen: Afebrile. No acute distress. Nontoxic in appearance, well-developed, well-nourished, pleasant, obese, Caucasian female HENT: AT. [place]. Bilateral TM visualized and normal in appearance, normal external auditory canal. MMM, no oral lesions, adequate dentition. Bilateral nares within normal limits. Throat without erythema, ulcerations or exudates.  No cough on exam, no hoarseness on exam. Eyes:Pupils Equal Round Reactive to light, Extraocular movements intact,  Conjunctiva without redness, discharge or icterus. Neck/lymp/endocrine: Supple, no lymphadenopathy, no thyromegaly CV: RRR no murmur, no edema, +2/4 P posterior tibialis pulses.  No carotid bruits. No JVD. Chest: CTAB, no wheeze, rhonchi or crackles.  Normal respiratory effort.  Good air movement. Abd: Soft.  Obese. NTND. BS present.  No masses palpated. No hepatosplenomegaly. No rebound tenderness or guarding. Skin: No rashes, purpura or petechiae. Warm and well-perfused. Skin intact. Neuro/Msk:  Normal gait. PERLA. EOMi. Alert. Oriented x3.  Cranial nerves II through XII intact. Muscle strength 5/5 upper/lower extremity. DTRs equal bilaterally. Psych: Normal affect, dress and demeanor. Normal speech. Normal thought content and judgment.  Assessment/plan: Rachael Nelson is a 58 y.o. female present for est/cpe Obesity (BMI 30-39.9) Rater than 150 minutes of routine exercise weekly encouraged - Comp Met (CMET) - Lipid panel Asthma: Exercise induced. Stable. Refilled albuterol.  Diabetes mellitus screening - Hemoglobin A1c Screening for deficiency anemia - CBC Need for shingles vaccine Shingrix No. 1 provided today.  Shingrix No. 2 we have provided by nurse visit approximately 14 weeks.  Would like to keep immunizations separated if possible at least 4 weeks and she will be coming in for a Tdap next month, and  then a flu shot the following month. - Varicella-zoster vaccine IM Encounter for screening for HIV/Encounter for hepatitis C screening test for low risk patient -Patient agreeable and desire testing/screening.  Unfortunately order was missed during collection.  Changed order to future and on call back will inform her it was missed and we can collect it when she comes in for her-5-week nurse appointment for her tetanus vaccination. - HIV antibody (with reflex); Future - Hepatitis C Antibody; Future  Encounter for preventive care Patient was encouraged to exercise greater than 150 minutes a week. Patient was encouraged to choose a diet filled with fresh fruits and vegetables, and lean meats. AVS provided to patient today for education/recommendation on gender specific health and safety maintenance. Colonoscopy: DUE 2028- records requested [facility] Mammogram: completed:02/2019, [facility]>> requested records Cervical cancer screening: last pap: 02/2019, [facility]>> records requested Immunizations: tdap Due- nurse visit 4 weeks , Influenza  (encouraged yearly),  shingrix start today #1 and nurse visit 3-6 mos #2 Infectious  disease screening: HIV and Hep C - pt desires screens. - missed on collection>>> will over draw on her nurse visit for tdap in 1 mo. DEXA: routine screen 60-65  Return in about 1 year (around 05/16/2020) for CPE (30 min).   Note is dictated utilizing voice recognition software. Although note has been proof read prior to signing, occasional typographical errors still can be missed. If any questions arise, please do not hesitate to call for verification.  Electronically signed by: Howard Pouch, DO [place]

## 2019-05-17 NOTE — Patient Instructions (Addendum)
Health Maintenance, Female Adopting a healthy lifestyle and getting preventive care are important in promoting health and wellness. Ask your health care provider about:  The right schedule for you to have regular tests and exams.  Things you can do on your own to prevent diseases and keep yourself healthy. What should I know about diet, weight, and exercise? Eat a healthy diet   Eat a diet that includes plenty of vegetables, fruits, low-fat dairy products, and lean protein.  Do not eat a lot of foods that are high in solid fats, added sugars, or sodium. Maintain a healthy weight Body mass index (BMI) is used to identify weight problems. It estimates body fat based on height and weight. Your health care provider can help determine your BMI and help you achieve or maintain a healthy weight. Get regular exercise Get regular exercise. This is one of the most important things you can do for your health. Most adults should:  Exercise for at least 150 minutes each week. The exercise should increase your heart rate and make you sweat (moderate-intensity exercise).  Do strengthening exercises at least twice a week. This is in addition to the moderate-intensity exercise.  Spend less time sitting. Even light physical activity can be beneficial. Watch cholesterol and blood lipids Have your blood tested for lipids and cholesterol at 58 years of age, then have this test every 5 years. Have your cholesterol levels checked more often if:  Your lipid or cholesterol levels are high.  You are older than 58 years of age.  You are at high risk for heart disease. What should I know about cancer screening? Depending on your health history and family history, you may need to have cancer screening at various ages. This may include screening for:  Breast cancer.  Cervical cancer.  Colorectal cancer.  Skin cancer.  Lung cancer. What should I know about heart disease, diabetes, and high blood  pressure? Blood pressure and heart disease  High blood pressure causes heart disease and increases the risk of stroke. This is more likely to develop in people who have high blood pressure readings, are of African descent, or are overweight.  Have your blood pressure checked: ? Every 3-5 years if you are 18-39 years of age. ? Every year if you are 40 years old or older. Diabetes Have regular diabetes screenings. This checks your fasting blood sugar level. Have the screening done:  Once every three years after age 40 if you are at a normal weight and have a low risk for diabetes.  More often and at a younger age if you are overweight or have a high risk for diabetes. What should I know about preventing infection? Hepatitis B If you have a higher risk for hepatitis B, you should be screened for this virus. Talk with your health care provider to find out if you are at risk for hepatitis B infection. Hepatitis C Testing is recommended for:  Everyone born from 1945 through 1965.  Anyone with known risk factors for hepatitis C. Sexually transmitted infections (STIs)  Get screened for STIs, including gonorrhea and chlamydia, if: ? You are sexually active and are younger than 58 years of age. ? You are older than 58 years of age and your health care provider tells you that you are at risk for this type of infection. ? Your sexual activity has changed since you were last screened, and you are at increased risk for chlamydia or gonorrhea. Ask your health care provider if   you are at risk.  Ask your health care provider about whether you are at high risk for HIV. Your health care provider may recommend a prescription medicine to help prevent HIV infection. If you choose to take medicine to prevent HIV, you should first get tested for HIV. You should then be tested every 3 months for as long as you are taking the medicine. Pregnancy  If you are about to stop having your period (premenopausal) and  you may become pregnant, seek counseling before you get pregnant.  Take 400 to 800 micrograms (mcg) of folic acid every day if you become pregnant.  Ask for birth control (contraception) if you want to prevent pregnancy. Osteoporosis and menopause Osteoporosis is a disease in which the bones lose minerals and strength with aging. This can result in bone fractures. If you are 65 years old or older, or if you are at risk for osteoporosis and fractures, ask your health care provider if you should:  Be screened for bone loss.  Take a calcium or vitamin D supplement to lower your risk of fractures.  Be given hormone replacement therapy (HRT) to treat symptoms of menopause. Follow these instructions at home: Lifestyle  Do not use any products that contain nicotine or tobacco, such as cigarettes, e-cigarettes, and chewing tobacco. If you need help quitting, ask your health care provider.  Do not use street drugs.  Do not share needles.  Ask your health care provider for help if you need support or information about quitting drugs. Alcohol use  Do not drink alcohol if: ? Your health care provider tells you not to drink. ? You are pregnant, may be pregnant, or are planning to become pregnant.  If you drink alcohol: ? Limit how much you use to 0-1 drink a day. ? Limit intake if you are breastfeeding.  Be aware of how much alcohol is in your drink. In the [place] one drink equals one 12 oz bottle of beer (355 mL), one 5 oz glass of wine (148 mL), or one 1 oz glass of hard liquor (44 mL). General instructions  Schedule regular health, dental, and eye exams.  Stay current with your vaccines.  Tell your health care provider if: ? You often feel depressed. ? You have ever been abused or do not feel safe at home. Summary  Adopting a healthy lifestyle and getting preventive care are important in promoting health and wellness.  Follow your health care provider's instructions about healthy  diet, exercising, and getting tested or screened for diseases.  Follow your health care provider's instructions on monitoring your cholesterol and blood pressure. This information is not intended to replace advice given to you by your health care provider. Make sure you discuss any questions you have with your health care provider. Document Released: 04/18/2011 Document Revised: 09/26/2018 Document Reviewed: 09/26/2018 Elsevier Patient Education  2020 Elsevier Inc[employer]  Please help us help you:  We are honored you have chosen [facility] for your Primary Care home. Below you will find basic instructions that you may need to access in the future. Please help us help you by reading the instructions, which cover many of the frequent questions we experience.   Prescription refills and request:  -In order to allow more efficient response time, please call your pharmacy for all refills. They will forward the request electronically to us. This allows for the quickest possible response. Request left on a nurse line can take longer to refill, since these are checked   as time allows between office patients and other phone calls.  - refill request can take up to 3-5 working days to complete.  - If request is sent electronically and request is appropiate, it is usually completed in 1-2 business days.  - all patients will need to be seen routinely for all chronic medical conditions requiring prescription medications (see follow-up below). If you are overdue for follow up on your condition, you will be asked to make an appointment and we will call in enough medication to cover you until your appointment (up to 30 days).  - all controlled substances will require a face to face visit to request/refill.  - if you desire your prescriptions to go through a new pharmacy, and have an active script at original pharmacy, you will need to call your pharmacy and have scripts transferred to new pharmacy. This is completed  between the pharmacy locations and not by your provider.    Results: If any images or labs were ordered, it can take up to 1 week to get results depending on the test ordered and the lab/facility running and resulting the test. - Normal or stable results, which do not need further discussion, may be released to your mychart immediately with attached note to you. A call may not be generated for normal results. Please make certain to sign up for mychart. If you have questions on how to activate your mychart you can call the front office.  - If your results need further discussion, our office will attempt to contact you via phone, and if unable to reach you after 2 attempts, we will release your abnormal result to your mychart with instructions.  - All results will be automatically released in mychart after 1 week.  - Your provider will provide you with explanation and instruction on all relevant material in your results. Please keep in mind, results and labs may appear confusing or abnormal to the untrained eye, but it does not mean they are actually abnormal for you personally. If you have any questions about your results that are not covered, or you desire more detailed explanation than what was provided, you should make an appointment with your provider to do so.   Our office handles many outgoing and incoming calls daily. If we have not contacted you within 1 week about your results, please check your mychart to see if there is a message first and if not, then contact our office.  In helping with this matter, you help decrease call volume, and therefore allow us to be able to respond to patients needs more efficiently.   Acute office visits (sick visit):  An acute visit is intended for a new problem and are scheduled in shorter time slots to allow schedule openings for patients with new problems. This is the appropriate visit to discuss a new problem. Problems will not be addressed by phone call or  Echart message. Appointment is needed if requesting treatment. In order to provide you with excellent quality medical care with proper time for you to explain your problem, have an exam and receive treatment with instructions, these appointments should be limited to one new problem per visit. If you experience a new problem, in which you desire to be addressed, please make an acute office visit, we save openings on the schedule to accommodate you. Please do not save your new problem for any other type of visit, let us take care of it properly and quickly for you.   Follow   up visits:  Depending on your condition(s) your provider will need to see you routinely in order to provide you with quality care and prescribe medication(s). Most chronic conditions (Example: hypertension, Diabetes, depression/anxiety... etc), require visits a couple times a year. Your provider will instruct you on proper follow up for your personal medical conditions and history. Please make certain to make follow up appointments for your condition as instructed. Failing to do so could result in lapse in your medication treatment/refills. If you request a refill, and are overdue to be seen on a condition, we will always provide you with a 30 day script (once) to allow you time to schedule.    Medicare wellness (well visit): - we have a wonderful Nurse (Kim), that will meet with you and provide you will yearly medicare wellness visits. These visits should occur yearly (can not be scheduled less than 1 calendar year apart) and cover preventive health, immunizations, advance directives and screenings you are entitled to yearly through your medicare benefits. Do not miss out on your entitled benefits, this is when medicare will pay for these benefits to be ordered for you.  These are strongly encouraged by your provider and is the appropriate type of visit to make certain you are up to date with all preventive health benefits. If you have not  had your medicare wellness exam in the last 12 months, please make certain to schedule one by calling the office and schedule your medicare wellness with Kim as soon as possible.   Yearly physical (well visit):  - Adults are recommended to be seen yearly for physicals. Check with your insurance and date of your last physical, most insurances require one calendar year between physicals. Physicals include all preventive health topics, screenings, medical exam and labs that are appropriate for gender/age and history. You may have fasting labs needed at this visit. This is a well visit (not a sick visit), new problems should not be covered during this visit (see acute visit).  - Pediatric patients are seen more frequently when they are younger. Your provider will advise you on well child visit timing that is appropriate for your their age. - This is not a medicare wellness visit. Medicare wellness exams do not have an exam portion to the visit. Some medicare companies allow for a physical, some do not allow a yearly physical. If your medicare allows a yearly physical you can schedule the medicare wellness with our nurse Kim and have your physical with your provider after, on the same day. Please check with insurance for your full benefits.   Late Policy/No Shows:  - all new patients should arrive 15-30 minutes earlier than appointment to allow us time  to  obtain all personal demographics,  insurance information and for you to complete office paperwork. - All established patients should arrive 10-15 minutes earlier than appointment time to update all information and be checked in .  - In our best efforts to run on time, if you are late for your appointment you will be asked to either reschedule or if able, we will work you back into the schedule. There will be a wait time to work you back in the schedule,  depending on availability.  - If you are unable to make it to your appointment as scheduled, please call  24 hours ahead of time to allow us to fill the time slot with someone else who needs to be seen. If you do not cancel your appointment ahead of   time, you may be charged a no show fee.  

## 2019-05-18 LAB — COMPREHENSIVE METABOLIC PANEL
AG Ratio: 1.4 (calc) (ref 1.0–2.5)
ALT: 16 U/L (ref 6–29)
AST: 19 U/L (ref 10–35)
Albumin: 4.1 g/dL (ref 3.6–5.1)
Alkaline phosphatase (APISO): 66 U/L (ref 37–153)
BUN: 16 mg/dL (ref 7–25)
CO2: 27 mmol/L (ref 20–32)
Calcium: 10.2 mg/dL (ref 8.6–10.4)
Chloride: 103 mmol/L (ref 98–110)
Creat: 0.72 mg/dL (ref 0.50–1.05)
Globulin: 3 g/dL (calc) (ref 1.9–3.7)
Glucose, Bld: 98 mg/dL (ref 65–99)
Potassium: 5 mmol/L (ref 3.5–5.3)
Sodium: 141 mmol/L (ref 135–146)
Total Bilirubin: 0.4 mg/dL (ref 0.2–1.2)
Total Protein: 7.1 g/dL (ref 6.1–8.1)

## 2019-05-18 LAB — LIPID PANEL
Cholesterol: 177 mg/dL (ref <200)
HDL: 58 mg/dL (ref >=50)
LDL Cholesterol (Calc): 92 mg/dL (calc)
Non-HDL Cholesterol (Calc): 119 mg/dL (calc) (ref <130)
Total CHOL/HDL Ratio: 3.1 (calc) (ref <5.0)
Triglycerides: 175 mg/dL — ABNORMAL HIGH (ref <150)

## 2019-05-18 LAB — CBC
HCT: 42.7 % (ref 35.0–45.0)
Hemoglobin: 14.6 g/dL (ref 11.7–15.5)
MCH: 29.7 pg (ref 27.0–33.0)
MCHC: 34.2 g/dL (ref 32.0–36.0)
MCV: 87 fL (ref 80.0–100.0)
MPV: 10.9 fL (ref 7.5–12.5)
Platelets: 319 10*3/uL (ref 140–400)
RBC: 4.91 10*6/uL (ref 3.80–5.10)
RDW: 13.2 % (ref 11.0–15.0)
WBC: 7.8 10*3/uL (ref 3.8–10.8)

## 2019-05-18 LAB — HEMOGLOBIN A1C
Hgb A1c MFr Bld: 5.4 % of total Hgb (ref <5.7)
Mean Plasma Glucose: 108 (calc)
eAG (mmol/L): 6 (calc)

## 2019-05-21 ENCOUNTER — Encounter: Payer: Self-pay | Admitting: Family Medicine

## 2019-06-25 ENCOUNTER — Other Ambulatory Visit: Payer: Self-pay

## 2019-06-25 ENCOUNTER — Ambulatory Visit (INDEPENDENT_AMBULATORY_CARE_PROVIDER_SITE_OTHER): Payer: BC Managed Care – PPO | Admitting: Family Medicine

## 2019-06-25 DIAGNOSIS — Z23 Encounter for immunization: Secondary | ICD-10-CM | POA: Diagnosis not present

## 2019-06-25 NOTE — Progress Notes (Signed)
Patient presented to office today for TDAP vaccination, orders in last OV dated 05/17/2019.  Patient tolerated injection well.

## 2019-08-23 ENCOUNTER — Other Ambulatory Visit: Payer: Self-pay

## 2019-08-23 ENCOUNTER — Ambulatory Visit (INDEPENDENT_AMBULATORY_CARE_PROVIDER_SITE_OTHER): Payer: BC Managed Care – PPO | Admitting: Family Medicine

## 2019-08-23 DIAGNOSIS — Z23 Encounter for immunization: Secondary | ICD-10-CM | POA: Diagnosis not present

## 2019-11-22 ENCOUNTER — Ambulatory Visit (INDEPENDENT_AMBULATORY_CARE_PROVIDER_SITE_OTHER): Payer: BC Managed Care – PPO | Admitting: Family Medicine

## 2019-11-22 ENCOUNTER — Other Ambulatory Visit: Payer: Self-pay

## 2019-11-22 ENCOUNTER — Telehealth: Payer: Self-pay

## 2019-11-22 ENCOUNTER — Encounter: Payer: Self-pay | Admitting: Family Medicine

## 2019-11-22 VITALS — BP 135/81 | HR 58 | Temp 97.6°F | Resp 16 | Ht 61.0 in | Wt 180.4 lb

## 2019-11-22 DIAGNOSIS — L02818 Cutaneous abscess of other sites: Secondary | ICD-10-CM | POA: Diagnosis not present

## 2019-11-22 MED ORDER — SULFAMETHOXAZOLE-TRIMETHOPRIM 800-160 MG PO TABS: 1.0000 | ORAL_TABLET | Freq: Two times a day (BID) | ORAL | 0 refills | Status: DC

## 2019-11-22 MED ORDER — MUPIROCIN CALCIUM 2 % EX CREA: 1.0000 "application " | TOPICAL_CREAM | Freq: Two times a day (BID) | CUTANEOUS | 0 refills | Status: DC

## 2019-11-22 NOTE — Patient Instructions (Addendum)
Great to see you today.   COVID-19 Vaccine Information can be found at: [email or web address] For questions related to vaccine distribution or appointments, please email [email or web address] or call [phone].  Covid Vaccine appointment go to [email or web address].  Start antibiotic today- every 12 hours for 5 days . Use cream over area twice a day.  Warm epson salt soaks compresses until resolved. If worsens follow up in 1 week.  Try using Hibiclens over the counter for cleansing daily and if ruptures.      Skin Abscess  A skin abscess is an infected area of your skin that contains pus and other material. An abscess can happen in any part of your body. Some abscesses break open (rupture) on their own. Most continue to get worse unless they are treated. The infection can spread deeper into the body and into your blood, which can make you feel sick. A skin abscess is caused by germs that enter the skin through a cut or scrape. It can also be caused by blocked oil and sweat glands or infected hair follicles. This condition is usually treated by:  Draining the pus.  Taking antibiotic medicines.  Placing a warm, wet washcloth over the abscess. Follow these instructions at home: Medicines   Take over-the-counter and prescription medicines only as told by your doctor.  If you were prescribed an antibiotic medicine, take it as told by your doctor. Do not stop taking the antibiotic even if you start to feel better. Abscess care   If you have an abscess that has not drained, place a warm, clean, wet washcloth over the abscess several times a day. Do this as told by your doctor.  Follow instructions from your doctor about how to take care of your abscess. Make sure you: ? Cover the abscess with a bandage (dressing). ? Change your bandage or gauze as told by your doctor. ? Wash your hands with soap and water before you  change the bandage or gauze. If you cannot use soap and water, use hand sanitizer.  Check your abscess every day for signs that the infection is getting worse. Check for: ? More redness, swelling, or pain. ? More fluid or blood. ? Warmth. ? More pus or a bad smell. General instructions  To avoid spreading the infection: ? Do not share personal care items, towels, or hot tubs with others. ? Avoid making skin-to-skin contact with other people.  Keep all follow-up visits as told by your doctor. This is important. Contact a doctor if:  You have more redness, swelling, or pain around your abscess.  You have more fluid or blood coming from your abscess.  Your abscess feels warm when you touch it.  You have more pus or a bad smell coming from your abscess.  You have a fever.  Your muscles ache.  You have chills.  You feel sick. Get help right away if:  You have very bad (severe) pain.  You see red streaks on your skin spreading away from the abscess. Summary  A skin abscess is an infected area of your skin that contains pus and other material.  The abscess is caused by germs that enter the skin through a cut or scrape. It can also be caused by blocked oil and sweat glands or infected hair follicles.  Follow your doctor's instructions on caring for your abscess, taking medicines, preventing infections, and keeping follow-up visits. This information is not intended to replace advice given to you by your health care provider. Make sure  you discuss any questions you have with your health care provider. Document Revised: 05/09/2019 Document Reviewed: 11/16/2017 Elsevier Patient Education  2020 [employer].

## 2019-11-22 NOTE — Telephone Encounter (Signed)
Pharmacy called and they will pay for ointment, not cream. Gave verbal approval to Weston Brass, pharmacist, to use ointment that was covered. Pt was called and given information

## 2019-11-22 NOTE — Progress Notes (Signed)
This visit occurred during the SARS-CoV-2 public health emergency.  Safety protocols were in place, including screening questions prior to the visit, additional usage of staff PPE, and extensive cleaning of exam room while observing appropriate contact time as indicated for disinfecting solutions.    Rachael Nelson , 1961/09/28, 59 y.o., female MRN: [id number] Patient Care Team    Relationship Specialty Notifications Start End  Natalia Leatherwood, DO PCP - General Family Medicine  05/17/19   Zelphia Cairo, MD Consulting Physician Obstetrics and Gynecology  05/17/19     Chief Complaint  Patient presents with  . Mass    Pt woke up with lump this morning around vaginal/anus area. Pt unsure of exactly where     Subjective: Pt presents for an OV with complaints of lump near her thigh/vaginal area that she noticed this morning.  She noticed the area when washing, and at the time it was not painful.  She states it is starting to become more painful and noticeable now.  With sitting there is a pressure over the area that is causing her some mild discomfort.  She denies any bowel movement changes, dysuria, vaginal bleeding, fever or chills.  She is never had difficulties with hemorrhoids in the past.  Patient's last menstrual period was 08/08/2011.   Depression screen PHQ 2/9 05/17/2019  Decreased Interest 0  Down, Depressed, Hopeless 0  PHQ - 2 Score 0    No Known Allergies Social History   Social History Narrative   Marital status/children/pets: Divorced.  Has twin sons in college.   Education/employment: Masters degree, works as a Research officer, political party:      -Wears a bicycle helmet riding a bike: Yes     -smoke alarm in the home:Yes     - wears seatbelt: Yes      Past Medical History:  Diagnosis Date  . Asthma   . Chicken pox   . Exercise-induced asthma    Past Surgical History:  Procedure Laterality Date  . BREAST BIOPSY Left 2016   benign  . CESAREAN SECTION  1999  .  CHOLECYSTECTOMY  1996  . WISDOM TOOTH EXTRACTION  1984   Family History  Problem Relation Age of Onset  . Arthritis Mother   . Diabetes Mother   . Hypertension Mother   . Heart disease Father   . Hypertension Father   . Diabetes Maternal Grandmother   . Stroke Maternal Grandmother   . Heart disease Maternal Grandfather   . Stroke Paternal Grandmother   . Early death Paternal Grandfather    Allergies as of 11/22/2019   No Known Allergies      Medication List        Accurate as of November 22, 2019 11:38 AM. If you have any questions, ask your nurse or doctor.          albuterol 108 (90 Base) MCG/ACT inhaler Commonly known as: VENTOLIN HFA Inhale 1-2 puffs into the lungs every 6 (six) hours as needed for wheezing or shortness of breath.   CALCIUM + D PO Take 1 tablet by mouth daily. 2,000 units Vit D , 500mg  Vit C   glucosamine-chondroitin 500-400 MG tablet Take 1 tablet by mouth 3 (three) times daily.   multivitamins ther. w/minerals Tabs tablet Take 1 tablet by mouth daily.   Osteo Bi-Flex Adv Triple St Tabs Take 1 tablet by mouth daily.   VITAMIN D (CHOLECALCIFEROL) PO Take 1 tablet by mouth daily.  All past medical history, surgical history, allergies, family history, immunizations andmedications were updated in the EMR today and reviewed under the history and medication portions of their EMR.     ROS: Negative, with the exception of above mentioned in HPI   Objective:  BP 135/81 (BP Location: Right Arm, Patient Position: Sitting, Cuff Size: Normal)   Pulse (!) 58   Temp 97.6 F (36.4 C) (Temporal)   Resp 16   Ht 5\' 1"  (1.549 m)   Wt 180 lb 6 oz (81.8 kg)   LMP 08/08/2011   SpO2 97%   BMI 34.08 kg/m  Body mass index is 34.08 kg/m. Gen: Afebrile. No acute distress. Nontoxic in appearance, well developed, well nourished.  Skin: Left upper inner thigh oval-shaped erythema of approximately 4 cm with a small palpable abscess less than 1 cm.   Skin is intact.  No drainage present.  Mildly tender to palpation.  No exam data present No results found. No results found for this or any previous visit (from the past 24 hour(s)).  Assessment/Plan: Rachael Nelson is a 59 y.o. female present for OV for  Cutaneous abscess of other site Area of concern is located at her left inner upper thigh.  She has a small skin abscess at this location.  Secondary to very small size will treat with oral antibiotics Bactrim DS twice daily x5 days. Bactroban cream application twice daily. Warm Epson salt compresses 3 times daily Cleanse with Hibiclens over-the-counter daily.  Patient was given instructions if abscess does come to a small head and ruptures to cleansed thoroughly with Hibiclens, apply cream and keep covered till completely healed. If abscess becomes larger or more painful follow-up in 1 week.   Reviewed expectations re: course of current medical issues.  Discussed self-management of symptoms.  Outlined signs and symptoms indicating need for more acute intervention.  Patient verbalized understanding and all questions were answered.  Patient received an After-Visit Summary.     No orders of the defined types were placed in this encounter.  Meds ordered this encounter  Medications  . sulfamethoxazole-trimethoprim (BACTRIM DS) 800-160 MG tablet    Sig: Take 1 tablet by mouth 2 (two) times daily.    Dispense:  10 tablet    Refill:  0  . mupirocin cream (BACTROBAN) 2 %    Sig: Apply 1 application topically 2 (two) times daily.    Dispense:  15 g    Refill:  0    Note is dictated utilizing voice recognition software. Although note has been proof read prior to signing, occasional typographical errors still can be missed. If any questions arise, please do not hesitate to call for verification.   electronically signed by:  Howard Pouch, DO  [facility]

## 2019-11-22 NOTE — Telephone Encounter (Signed)
Pt called and stated she heard from her pharmacy that the mupirocin cream (BACTROBAN) 2 %will cost about $160 and that her pharmacy was going to send over another recommendation for Dr. Claiborne Billings to review that would be a cheaper alternative.  Pt stated she had no problem paying that much for the cream if Dr. Claiborne Billings feels that it is the better choice for her out of the two.  Pt just wanted to get the best option filled prior to going into the weekend.

## 2020-01-06 ENCOUNTER — Ambulatory Visit: Payer: BC Managed Care – PPO | Attending: Internal Medicine

## 2020-01-06 DIAGNOSIS — Z23 Encounter for immunization: Secondary | ICD-10-CM

## 2020-01-06 NOTE — Progress Notes (Signed)
   Covid-19 Vaccination Clinic  Name:  Rachael Nelson    MRN: [id number] DOB: 08-25-1961  01/06/2020  [name] was observed post Covid-19 immunization for 15 minutes without incident. She was provided with Vaccine Information Sheet and instruction to access the V-Safe system.   [name] was instructed to call 911 with any severe reactions post vaccine: Marland Kitchen Difficulty breathing  . Swelling of face and throat  . A fast heartbeat  . A bad rash all over body  . Dizziness and weakness   Immunizations Administered    Name Date Dose VIS Date Route   Pfizer COVID-19 Vaccine 01/06/2020  9:09 AM 0.3 mL 09/27/2019 Intramuscular   Manufacturer: [employer], [employer]   Lot: [id number]   NDC: [address]-4

## 2020-01-29 ENCOUNTER — Ambulatory Visit: Payer: BC Managed Care – PPO | Attending: Internal Medicine

## 2020-01-29 DIAGNOSIS — Z23 Encounter for immunization: Secondary | ICD-10-CM

## 2020-01-29 NOTE — Progress Notes (Signed)
   Covid-19 Vaccination Clinic  Name:  Zekiah Coen    MRN: [id number] DOB: 1961/05/22  01/29/2020  Ms. Veach was observed post Covid-19 immunization for 15 minutes without incident. She was provided with Vaccine Information Sheet and instruction to access the V-Safe system.   Ms. Cannedy was instructed to call 911 with any severe reactions post vaccine: Marland Kitchen Difficulty breathing  . Swelling of face and throat  . A fast heartbeat  . A bad rash all over body  . Dizziness and weakness   Immunizations Administered    Name Date Dose VIS Date Route   Pfizer COVID-19 Vaccine 01/29/2020 10:15 AM 0.3 mL 09/27/2019 Intramuscular   Manufacturer: [employer], [employer]   Lot: [id number]   NDC: [address]-7

## 2020-04-09 DIAGNOSIS — S61501A Unspecified open wound of right wrist, initial encounter: Secondary | ICD-10-CM | POA: Diagnosis not present

## 2020-04-09 DIAGNOSIS — L989 Disorder of the skin and subcutaneous tissue, unspecified: Secondary | ICD-10-CM | POA: Diagnosis not present

## 2020-04-22 DIAGNOSIS — R319 Hematuria, unspecified: Secondary | ICD-10-CM | POA: Diagnosis not present

## 2020-04-22 DIAGNOSIS — N950 Postmenopausal bleeding: Secondary | ICD-10-CM | POA: Diagnosis not present

## 2020-05-12 DIAGNOSIS — Z113 Encounter for screening for infections with a predominantly sexual mode of transmission: Secondary | ICD-10-CM | POA: Diagnosis not present

## 2020-05-12 DIAGNOSIS — N950 Postmenopausal bleeding: Secondary | ICD-10-CM | POA: Diagnosis not present

## 2020-06-26 DIAGNOSIS — Z01419 Encounter for gynecological examination (general) (routine) without abnormal findings: Secondary | ICD-10-CM | POA: Diagnosis not present

## 2020-06-26 DIAGNOSIS — Z1231 Encounter for screening mammogram for malignant neoplasm of breast: Secondary | ICD-10-CM | POA: Diagnosis not present

## 2020-06-26 DIAGNOSIS — Z6830 Body mass index (BMI) 30.0-30.9, adult: Secondary | ICD-10-CM | POA: Diagnosis not present

## 2020-06-30 LAB — HM MAMMOGRAPHY

## 2020-07-17 DIAGNOSIS — U071 COVID-19: Secondary | ICD-10-CM

## 2020-07-17 HISTORY — DX: COVID-19: U07.1

## 2020-07-28 DIAGNOSIS — U071 COVID-19: Secondary | ICD-10-CM | POA: Diagnosis not present

## 2020-07-28 DIAGNOSIS — Z20822 Contact with and (suspected) exposure to covid-19: Secondary | ICD-10-CM | POA: Diagnosis not present

## 2020-08-07 DIAGNOSIS — Z711 Person with feared health complaint in whom no diagnosis is made: Secondary | ICD-10-CM | POA: Diagnosis not present

## 2020-11-21 DIAGNOSIS — R350 Frequency of micturition: Secondary | ICD-10-CM | POA: Diagnosis not present

## 2020-11-21 DIAGNOSIS — N3000 Acute cystitis without hematuria: Secondary | ICD-10-CM | POA: Diagnosis not present

## 2020-12-09 ENCOUNTER — Other Ambulatory Visit: Payer: Self-pay

## 2020-12-09 ENCOUNTER — Ambulatory Visit (INDEPENDENT_AMBULATORY_CARE_PROVIDER_SITE_OTHER): Payer: BC Managed Care – PPO | Admitting: Family Medicine

## 2020-12-09 ENCOUNTER — Encounter: Payer: Self-pay | Admitting: Family Medicine

## 2020-12-09 VITALS — BP 109/71 | HR 64 | Temp 98.0°F | Ht 60.0 in | Wt 153.0 lb

## 2020-12-09 DIAGNOSIS — Z131 Encounter for screening for diabetes mellitus: Secondary | ICD-10-CM | POA: Diagnosis not present

## 2020-12-09 DIAGNOSIS — Z114 Encounter for screening for human immunodeficiency virus [HIV]: Secondary | ICD-10-CM

## 2020-12-09 DIAGNOSIS — E669 Obesity, unspecified: Secondary | ICD-10-CM

## 2020-12-09 DIAGNOSIS — R5383 Other fatigue: Secondary | ICD-10-CM

## 2020-12-09 DIAGNOSIS — Z0000 Encounter for general adult medical examination without abnormal findings: Secondary | ICD-10-CM | POA: Diagnosis not present

## 2020-12-09 DIAGNOSIS — Z1159 Encounter for screening for other viral diseases: Secondary | ICD-10-CM

## 2020-12-09 DIAGNOSIS — Z130 Encounter for screening for diseases of the blood and blood-forming organs and certain disorders involving the immune mechanism: Secondary | ICD-10-CM | POA: Diagnosis not present

## 2020-12-09 LAB — COMPREHENSIVE METABOLIC PANEL
ALT: 17 U/L (ref 0–35)
AST: 25 U/L (ref 0–37)
Albumin: 4.1 g/dL (ref 3.5–5.2)
Alkaline Phosphatase: 64 U/L (ref 39–117)
BUN: 20 mg/dL (ref 6–23)
CO2: 32 mEq/L (ref 19–32)
Calcium: 9.9 mg/dL (ref 8.4–10.5)
Chloride: 102 mEq/L (ref 96–112)
Creatinine, Ser: 0.68 mg/dL (ref 0.40–1.20)
GFR: 95.5 mL/min (ref >60.00)
Glucose, Bld: 66 mg/dL — ABNORMAL LOW (ref 70–99)
Potassium: 5 mEq/L (ref 3.5–5.1)
Sodium: 139 mEq/L (ref 135–145)
Total Bilirubin: 0.4 mg/dL (ref 0.2–1.2)
Total Protein: 7.1 g/dL (ref 6.0–8.3)

## 2020-12-09 LAB — HEMOGLOBIN A1C: Hgb A1c MFr Bld: 5.4 % (ref 4.6–6.5)

## 2020-12-09 LAB — CBC WITH DIFFERENTIAL/PLATELET
Basophils Absolute: 0 10*3/uL (ref 0.0–0.1)
Basophils Relative: 0.9 % (ref 0.0–3.0)
Eosinophils Absolute: 0.1 10*3/uL (ref 0.0–0.7)
Eosinophils Relative: 1.1 % (ref 0.0–5.0)
HCT: 44.4 % (ref 36.0–46.0)
Hemoglobin: 14.8 g/dL (ref 12.0–15.0)
Lymphocytes Relative: 32.2 % (ref 12.0–46.0)
Lymphs Abs: 1.7 10*3/uL (ref 0.7–4.0)
MCHC: 33.4 g/dL (ref 30.0–36.0)
MCV: 89.1 fl (ref 78.0–100.0)
Monocytes Absolute: 0.4 10*3/uL (ref 0.1–1.0)
Monocytes Relative: 7.6 % (ref 3.0–12.0)
Neutro Abs: 3 10*3/uL (ref 1.4–7.7)
Neutrophils Relative %: 58.2 % (ref 43.0–77.0)
Platelets: 285 10*3/uL (ref 150.0–400.0)
RBC: 4.98 Mil/uL (ref 3.87–5.11)
RDW: 13.9 % (ref 11.5–15.5)
WBC: 5.2 10*3/uL (ref 4.0–10.5)

## 2020-12-09 LAB — LIPID PANEL
Cholesterol: 187 mg/dL (ref 0–200)
HDL: 80.8 mg/dL (ref >39.00)
LDL Cholesterol: 92 mg/dL (ref 0–99)
NonHDL: 106.35
Total CHOL/HDL Ratio: 2
Triglycerides: 70 mg/dL (ref 0.0–149.0)
VLDL: 14 mg/dL (ref 0.0–40.0)

## 2020-12-09 LAB — VITAMIN D 25 HYDROXY (VIT D DEFICIENCY, FRACTURES): VITD: 60.12 ng/mL (ref 30.00–100.00)

## 2020-12-09 LAB — TSH: TSH: 1.18 u[IU]/mL (ref 0.35–4.50)

## 2020-12-09 LAB — VITAMIN B12: Vitamin B-12: 545 pg/mL (ref 211–911)

## 2020-12-09 NOTE — Patient Instructions (Signed)
Health Maintenance, Female Adopting a healthy lifestyle and getting preventive care are important in promoting health and wellness. Ask your health care provider about:  The right schedule for you to have regular tests and exams.  Things you can do on your own to prevent diseases and keep yourself healthy. What should I know about diet, weight, and exercise? Eat a healthy diet  Eat a diet that includes plenty of vegetables, fruits, low-fat dairy products, and lean protein.  Do not eat a lot of foods that are high in solid fats, added sugars, or sodium.   Maintain a healthy weight Body mass index (BMI) is used to identify weight problems. It estimates body fat based on height and weight. Your health care provider can help determine your BMI and help you achieve or maintain a healthy weight. Get regular exercise Get regular exercise. This is one of the most important things you can do for your health. Most adults should:  Exercise for at least 150 minutes each week. The exercise should increase your heart rate and make you sweat (moderate-intensity exercise).  Do strengthening exercises at least twice a week. This is in addition to the moderate-intensity exercise.  Spend less time sitting. Even light physical activity can be beneficial. Watch cholesterol and blood lipids Have your blood tested for lipids and cholesterol at 60 years of age, then have this test every 5 years. Have your cholesterol levels checked more often if:  Your lipid or cholesterol levels are high.  You are older than 60 years of age.  You are at high risk for heart disease. What should I know about cancer screening? Depending on your health history and family history, you may need to have cancer screening at various ages. This may include screening for:  Breast cancer.  Cervical cancer.  Colorectal cancer.  Skin cancer.  Lung cancer. What should I know about heart disease, diabetes, and high blood  pressure? Blood pressure and heart disease  High blood pressure causes heart disease and increases the risk of stroke. This is more likely to develop in people who have high blood pressure readings, are of African descent, or are overweight.  Have your blood pressure checked: ? Every 3-5 years if you are 18-39 years of age. ? Every year if you are 40 years old or older. Diabetes Have regular diabetes screenings. This checks your fasting blood sugar level. Have the screening done:  Once every three years after age 40 if you are at a normal weight and have a low risk for diabetes.  More often and at a younger age if you are overweight or have a high risk for diabetes. What should I know about preventing infection? Hepatitis B If you have a higher risk for hepatitis B, you should be screened for this virus. Talk with your health care provider to find out if you are at risk for hepatitis B infection. Hepatitis C Testing is recommended for:  Everyone born from 1945 through 1965.  Anyone with known risk factors for hepatitis C. Sexually transmitted infections (STIs)  Get screened for STIs, including gonorrhea and chlamydia, if: ? You are sexually active and are younger than 60 years of age. ? You are older than 60 years of age and your health care provider tells you that you are at risk for this type of infection. ? Your sexual activity has changed since you were last screened, and you are at increased risk for chlamydia or gonorrhea. Ask your health care provider   if you are at risk.  Ask your health care provider about whether you are at high risk for HIV. Your health care provider may recommend a prescription medicine to help prevent HIV infection. If you choose to take medicine to prevent HIV, you should first get tested for HIV. You should then be tested every 3 months for as long as you are taking the medicine. Pregnancy  If you are about to stop having your period (premenopausal) and  you may become pregnant, seek counseling before you get pregnant.  Take 400 to 800 micrograms (mcg) of folic acid every day if you become pregnant.  Ask for birth control (contraception) if you want to prevent pregnancy. Osteoporosis and menopause Osteoporosis is a disease in which the bones lose minerals and strength with aging. This can result in bone fractures. If you are 65 years old or older, or if you are at risk for osteoporosis and fractures, ask your health care provider if you should:  Be screened for bone loss.  Take a calcium or vitamin D supplement to lower your risk of fractures.  Be given hormone replacement therapy (HRT) to treat symptoms of menopause. Follow these instructions at home: Lifestyle  Do not use any products that contain nicotine or tobacco, such as cigarettes, e-cigarettes, and chewing tobacco. If you need help quitting, ask your health care provider.  Do not use street drugs.  Do not share needles.  Ask your health care provider for help if you need support or information about quitting drugs. Alcohol use  Do not drink alcohol if: ? Your health care provider tells you not to drink. ? You are pregnant, may be pregnant, or are planning to become pregnant.  If you drink alcohol: ? Limit how much you use to 0-1 drink a day. ? Limit intake if you are breastfeeding.  Be aware of how much alcohol is in your drink. In the [place] one drink equals one 12 oz bottle of beer (355 mL), one 5 oz glass of wine (148 mL), or one 1 oz glass of hard liquor (44 mL). General instructions  Schedule regular health, dental, and eye exams.  Stay current with your vaccines.  Tell your health care provider if: ? You often feel depressed. ? You have ever been abused or do not feel safe at home. Summary  Adopting a healthy lifestyle and getting preventive care are important in promoting health and wellness.  Follow your health care provider's instructions about healthy  diet, exercising, and getting tested or screened for diseases.  Follow your health care provider's instructions on monitoring your cholesterol and blood pressure. This information is not intended to replace advice given to you by your health care provider. Make sure you discuss any questions you have with your health care provider. Document Revised: 09/26/2018 Document Reviewed: 09/26/2018 Elsevier Patient Education  2021 Elsevier Inc.  

## 2020-12-09 NOTE — Progress Notes (Signed)
This visit occurred during the SARS-CoV-2 public health emergency.  Safety protocols were in place, including screening questions prior to the visit, additional usage of staff PPE, and extensive cleaning of exam room while observing appropriate contact time as indicated for disinfecting solutions.    Patient ID: Rachael Nelson, female  DOB: 02/11/1961, 60 y.o.   MRN: [id number] Patient Care Team    Relationship Specialty Notifications Start End  Natalia Leatherwood, DO PCP - General Family Medicine  05/17/19   Zelphia Cairo, MD Consulting Physician Obstetrics and Gynecology  05/17/19     Chief Complaint  Patient presents with  . Annual Exam    Pt is not fasting;     Subjective:  Rachael Nelson is a 60 y.o.  Female  present for CPE. All past medical history, surgical history, allergies, family history, immunizations, medications and social history were updated in the electronic medical record today. All recent labs, ED visits and hospitalizations within the last year were reviewed.  Health maintenance:  Colonoscopy: completed 2018, by ? , resutls normal. follow up 10. Mammogram: completed:05/2020, [facility] Cervical cancer screening: last pap: 02/2019, [facility] Immunizations: tdap UTD 06/2019, Influenza UTD 2021 (encouraged yearly), shingrix completed, covid x3 Infectious disease screening: HIV and Hep C collected today  DEXA: declined Assistive device: none Oxygen YBO:FBPZ Patient has a Dental home. Hospitalizations/ED visits: reviewed  Depression screen [facility] 2/9 12/09/2020 12/09/2020 05/17/2019  Decreased Interest 0 0 0  Down, Depressed, Hopeless 0 0 0  PHQ - 2 Score 0 0 0   No flowsheet data found.   Immunization History  Administered Date(s) Administered  . Influenza,inj,Quad PF,6+ Mos 08/06/2019  . Influenza-Unspecified 10/16/2020  . PFIZER(Purple Top)SARS-COV-2 Vaccination 01/06/2020, 01/29/2020, 10/16/2020  . Tdap 06/25/2019  . Zoster Recombinat (Shingrix) 05/17/2019, 08/23/2019     Past Medical History:  Diagnosis Date  . Asthma   . Chicken pox   . COVID-19 07/2020   has leftover fatigue  . Exercise-induced asthma    No Known Allergies Past Surgical History:  Procedure Laterality Date  . BREAST BIOPSY Left 2016   benign  . CESAREAN SECTION  1999  . CHOLECYSTECTOMY  1996  . WISDOM TOOTH EXTRACTION  1984   Family History  Problem Relation Age of Onset  . Arthritis Mother   . Diabetes Mother   . Hypertension Mother   . Heart disease Father   . Hypertension Father   . Diabetes Maternal Grandmother   . Stroke Maternal Grandmother   . Heart disease Maternal Grandfather   . Stroke Paternal Grandmother   . Early death Paternal Grandfather    Social History   Social History Narrative   Marital status/children/pets: Divorced.  Has twin sons in college.   Education/employment: Masters degree, works as a Research officer, political party:      -Wears a bicycle helmet riding a bike: Yes     -smoke alarm in the home:Yes     - wears seatbelt: Yes       Allergies as of 12/09/2020   No Known Allergies     Medication List       Accurate as of December 09, 2020  9:03 AM. If you have any questions, ask your nurse or doctor.        STOP taking these medications   albuterol 108 (90 Base) MCG/ACT inhaler Commonly known as: VENTOLIN HFA Stopped by: Felix Pacini, DO   CALCIUM + D PO Stopped by: Felix Pacini, DO   mupirocin cream 2 %  Commonly known as: Bactroban Stopped by: Felix Pacini, DO   Osteo Bi-Flex Adv Triple St Tabs Stopped by: Felix Pacini, DO   sulfamethoxazole-trimethoprim 800-160 MG tablet Commonly known as: BACTRIM DS Stopped by: Felix Pacini, DO     TAKE these medications   glucosamine-chondroitin 500-400 MG tablet Take 1 tablet by mouth 3 (three) times daily.   multivitamins ther. w/minerals Tabs tablet Take 1 tablet by mouth daily.   VITAMIN D (CHOLECALCIFEROL) PO Take 1 tablet by mouth daily.       All past medical  history, surgical history, allergies, family history, immunizations andmedications were updated in the EMR today and reviewed under the history and medication portions of their EMR.     No results found for this or any previous visit (from the past 2160 hour(s)).    ROS: 14 pt review of systems performed and negative (unless mentioned in an HPI)  Objective: BP 109/71   Pulse 64   Temp 98 F (36.7 C) (Oral)   Ht 5' (1.524 m)   Wt 153 lb (69.4 kg)   LMP 08/08/2011   SpO2 100%   BMI 29.88 kg/m  Gen: Afebrile. No acute distress. Nontoxic in appearance, well-developed, well-nourished,  Pleasant female.  HENT: AT. [place]. Bilateral TM visualized and normal in appearance, normal external auditory canal. MMM, no oral lesions, adequate dentition. Bilateral nares within normal limits. Throat without erythema, ulcerations or exudates. no Cough on exam, no hoarseness on exam. Eyes:Pupils Equal Round Reactive to light, Extraocular movements intact,  Conjunctiva without redness, discharge or icterus. Neck/lymp/endocrine: Supple,no lymphadenopathy, no thyromegaly CV: RRR no murmur, no edema, +2/4 P posterior tibialis pulses.  Chest: CTAB, no wheeze, rhonchi or crackles. normal Respiratory effort. good Air movement. Abd: Soft. flat. NTND. BS present. no Masses palpated. No hepatosplenomegaly. No rebound tenderness or guarding. Skin: no rashes, purpura or petechiae. Warm and well-perfused. Skin intact. Neuro/Msk:  Normal gait. PERLA. EOMi. Alert. Oriented x3.  Cranial nerves II through XII intact. Muscle strength 5/5 upper/lower extremity. DTRs equal bilaterally. Psych: Normal affect, dress and demeanor. Normal speech. Normal thought content and judgment.   No exam data present  Assessment/plan: Rachael Nelson is a 60 y.o. female present for CPE Obesity (BMI 30-39.9) Losing weight and exercising! - Lipid panel  Encounter for hepatitis C screening test for low risk patient - Hepatitis C  Antibody  Encounter for screening for HIV - HIV antibody (with reflex)  fatigue Possibly s/p covid syndrome.  - Comprehensive metabolic panel - TSH - B12 - Vitamin D (25 hydroxy)  Screening for deficiency anemia - CBC with Differential/Platelet  Diabetes mellitus screening - Comprehensive metabolic panel - Hemoglobin A1c  Routine general medical examination at a health care facility Patient was encouraged to exercise greater than 150 minutes a week. Patient was encouraged to choose a diet filled with fresh fruits and vegetables, and lean meats. AVS provided to patient today for education/recommendation on gender specific health and safety maintenance. Colonoscopy: completed 2018, by ? , resutls normal. follow up 10. Mammogram: completed:05/2020, [facility] Cervical cancer screening: last pap: 02/2019, [facility] Immunizations: tdap UTD 06/2019, Influenza UTD 2021 (encouraged yearly), shingrix completed, covid x3 Infectious disease screening: HIV and Hep C collected today  DEXA: declined  Return in about 1 year (around 12/09/2021) for CPE (30 min).    Orders Placed This Encounter  Procedures  . CBC with Differential/Platelet  . Comprehensive metabolic panel  . Hemoglobin A1c  . Lipid panel  . TSH  . Hepatitis C Antibody  .  HIV antibody (with reflex)  . B12  . Vitamin D (25 hydroxy)   No orders of the defined types were placed in this encounter.  Referral Orders  No referral(s) requested today     Electronically signed by: Felix Pacini, DO [facility]

## 2020-12-10 LAB — HEPATITIS C ANTIBODY
Hepatitis C Ab: NONREACTIVE
SIGNAL TO CUT-OFF: 0.01 (ref <1.00)

## 2020-12-10 LAB — HIV ANTIBODY (ROUTINE TESTING W REFLEX): HIV 1&2 Ab, 4th Generation: NONREACTIVE

## 2020-12-17 ENCOUNTER — Telehealth: Payer: Self-pay

## 2020-12-17 NOTE — Telephone Encounter (Signed)
Received Mammogram results from [facility] on 12/15/20. Placed on PCP desk for review.

## 2021-02-09 DIAGNOSIS — R350 Frequency of micturition: Secondary | ICD-10-CM | POA: Diagnosis not present

## 2021-02-16 ENCOUNTER — Telehealth (INDEPENDENT_AMBULATORY_CARE_PROVIDER_SITE_OTHER): Payer: BC Managed Care – PPO | Admitting: Family Medicine

## 2021-02-16 DIAGNOSIS — R0981 Nasal congestion: Secondary | ICD-10-CM | POA: Diagnosis not present

## 2021-02-16 DIAGNOSIS — R059 Cough, unspecified: Secondary | ICD-10-CM | POA: Diagnosis not present

## 2021-02-16 NOTE — Progress Notes (Signed)
Virtual Visit via Video Note  I connected with@  on 02/16/21 at  3:20 PM EDT by a video enabled telemedicine application and verified that I am speaking with the correct person using two identifiers.  Location patient: home, [place] Location provider:work or home office Persons participating in the virtual visit: patient, provider  I discussed the limitations of evaluation and management by telemedicine and the availability of in person appointments. The patient expressed understanding and agreed to proceed.   HPI:  Acute telemedicine visit for a cough: -Onset: about 3-4 weeks; coughs on and off since having covid -Symptoms include: cough, nasal congestion, sneezing , watery eyes - notices is worse when mows the lawn -Denies: CP, SOB, fevers, malaise, hemoptysis -Has tried: nothing -Pertinent past medical history: had covid last October. -Pertinent medication allergies: nkda -COVID-19 vaccine status: vaccinated and boosted  ROS: See pertinent positives and negatives per HPI.  Past Medical History:  Diagnosis Date  . Asthma   . Chicken pox   . COVID-19 07/2020   has leftover fatigue  . Exercise-induced asthma     Past Surgical History:  Procedure Laterality Date  . BREAST BIOPSY Left 2016   benign  . CESAREAN SECTION  1999  . CHOLECYSTECTOMY  1996  . WISDOM TOOTH EXTRACTION  1984     Current Outpatient Medications:  .  glucosamine-chondroitin 500-400 MG tablet, Take 1 tablet by mouth 3 (three) times daily., Disp: , Rfl:  .  Multiple Vitamins-Minerals (MULTIVITAMINS THER. W/MINERALS) TABS, Take 1 tablet by mouth daily., Disp: , Rfl:  .  VITAMIN D, CHOLECALCIFEROL, PO, Take 1 tablet by mouth daily., Disp: , Rfl:   EXAM:  VITALS per patient if applicable:  GENERAL: alert, oriented, appears well and in no acute distress  HEENT: atraumatic, conjunttiva clear, no obvious abnormalities on inspection of external nose and ears  NECK: normal movements of the head and  neck  LUNGS: on inspection no signs of respiratory distress, breathing rate appears normal, no obvious gross SOB, gasping or wheezing  CV: no obvious cyanosis  MS: moves all visible extremities without noticeable abnormality  PSYCH/NEURO: pleasant and cooperative, no obvious depression or anxiety, speech and thought processing grossly intact  ASSESSMENT AND PLAN:  Discussed the following assessment and plan:  Cough  Nasal congestion  -we discussed possible serious and likely etiologies, options for evaluation and workup, limitations of telemedicine visit vs in person visit, treatment, treatment risks and precautions. Pt prefers to treat via telemedicine empirically rather than in person at this moment.  Query allergic rhinitis, viral upper respiratory illness versus other.  Discussed common and more serious causes of a chronic cough.  She is opted to try an allergy regimen with Allegra once daily along with Flonase 2 sprays each nostril daily.  She agrees to schedule follow-up with her primary care doctor in 2 weeks to reassess.  Scheduled follow up with PCP offered:patient reports she will contact PCP for follow up in 1-2 weeks. Advised to seek prompt in person care sooner if worsening, new symptoms arise, or if is not improving with treatment. Discussed options for inperson care if PCP office not available. Did let this patient know that I only do telemedicine on Tuesdays and Thursdays for [place]. Advised to schedule follow up visit with PCP or [facility] if any further questions or concerns to avoid delays in care.   I discussed the assessment and treatment plan with the patient. The patient was provided an opportunity to ask questions and all were answered.  The patient agreed with the plan and demonstrated an understanding of the instructions.     Lucretia Kern, DO

## 2021-02-16 NOTE — Patient Instructions (Addendum)
-  start allegra once daily  -flonase 2 sprays each nostril daily  -schedule a follow up in-person with our primary care office in about 1-2 weeks.  I hope you are feeling better soon!  Seek in person care promptly if your symptoms worsen, new concerns arise or you are not improving with treatment.  It was nice to meet you today. I help [facility] out with telemedicine visits on Tuesdays and Thursdays and am available for visits on those days. If you have any concerns or questions following this visit please schedule a follow up visit with your Primary Care doctor or seek care at a local urgent care clinic to avoid delays in care.

## 2021-04-07 ENCOUNTER — Other Ambulatory Visit: Payer: Self-pay

## 2021-04-07 ENCOUNTER — Ambulatory Visit (INDEPENDENT_AMBULATORY_CARE_PROVIDER_SITE_OTHER): Payer: BC Managed Care – PPO | Admitting: Family Medicine

## 2021-04-07 ENCOUNTER — Encounter: Payer: Self-pay | Admitting: Family Medicine

## 2021-04-07 VITALS — BP 112/70 | HR 54 | Temp 97.9°F | Ht 60.0 in | Wt 154.0 lb

## 2021-04-07 DIAGNOSIS — K219 Gastro-esophageal reflux disease without esophagitis: Secondary | ICD-10-CM | POA: Diagnosis not present

## 2021-04-07 DIAGNOSIS — R053 Chronic cough: Secondary | ICD-10-CM | POA: Diagnosis not present

## 2021-04-07 DIAGNOSIS — J302 Other seasonal allergic rhinitis: Secondary | ICD-10-CM

## 2021-04-07 MED ORDER — IPRATROPIUM BROMIDE 0.06 % NA SOLN: 2.0000 | Freq: Four times a day (QID) | NASAL | 12 refills | Status: DC

## 2021-04-07 MED ORDER — OMEPRAZOLE 40 MG PO CPDR: 40.0000 mg | DELAYED_RELEASE_CAPSULE | Freq: Every day | ORAL | 3 refills | Status: DC

## 2021-04-07 NOTE — Progress Notes (Signed)
This visit occurred during the SARS-CoV-2 public health emergency.  Safety protocols were in place, including screening questions prior to the visit, additional usage of staff PPE, and extensive cleaning of exam room while observing appropriate contact time as indicated for disinfecting solutions.    Rachael Nelson , 11-Sep-1961, 60 y.o., female MRN: [id number] Patient Care Team    Relationship Specialty Notifications Start End  Natalia Leatherwood, DO PCP - General Family Medicine  05/17/19   Zelphia Cairo, MD Consulting Physician Obstetrics and Gynecology  05/17/19     Chief Complaint  Patient presents with   Cough    Pt c/o productive cough that has unchanged x 3 mos     Subjective: Pt presents for an OV with complaints of cough of 3 mos duration.  Covid 19 in October 2021, seasonal allergies. Body mass index is 30.08 kg/m. Patient reports her symptoms typically present worse in midmorning after breakfast.  Symptoms were lightened throughout the day and then come in spurts in the afternoon and evening.   Breakfast: fruit and coffee x2.  She does endorse eating a late dinner around 8 PM nightly which is later than she has in the past. She has a history of allergies.  Pt has tried xyzal and steroid nasal spray to ease their symptoms.  She reports the cough is a mild tickle cough that sometimes can produce a small amount of Clear phlegm.  Depression screen [facility] 2/9 12/09/2020 12/09/2020 05/17/2019  Decreased Interest 0 0 0  Down, Depressed, Hopeless 0 0 0  PHQ - 2 Score 0 0 0    No Known Allergies Social History   Social History Narrative   Marital status/children/pets: Divorced.  Has twin sons in college.   Education/employment: Masters degree, works as a Research officer, political party:      -Wears a bicycle helmet riding a bike: Yes     -smoke alarm in the home:Yes     - wears seatbelt: Yes      Past Medical History:  Diagnosis Date   Asthma    Chicken pox    COVID-19 07/2020    has leftover fatigue   Exercise-induced asthma    Past Surgical History:  Procedure Laterality Date   BREAST BIOPSY Left 2016   benign   CESAREAN SECTION  1999   CHOLECYSTECTOMY  1996   WISDOM TOOTH EXTRACTION  1984   Family History  Problem Relation Age of Onset   Arthritis Mother    Diabetes Mother    Hypertension Mother    Heart disease Father    Hypertension Father    Diabetes Maternal Grandmother    Stroke Maternal Grandmother    Heart disease Maternal Grandfather    Stroke Paternal Grandmother    Early death Paternal Grandfather    Allergies as of 04/07/2021   No Known Allergies      Medication List        Accurate as of April 07, 2021 12:26 PM. If you have any questions, ask your nurse or doctor.          glucosamine-chondroitin 500-400 MG tablet Take 1 tablet by mouth 3 (three) times daily.   ipratropium 0.06 % nasal spray Commonly known as: ATROVENT Place 2 sprays into both nostrils 4 (four) times daily. Started by: Rachael Pacini, DO   multivitamins ther. w/minerals Tabs tablet Take 1 tablet by mouth daily.   omeprazole 40 MG capsule Commonly known as: PRILOSEC Take 1 capsule (40 mg  total) by mouth daily. Started by: Rachael Pacini, DO   VITAMIN D (CHOLECALCIFEROL) PO Take 1 tablet by mouth daily.        All past medical history, surgical history, allergies, family history, immunizations andmedications were updated in the EMR today and reviewed under the history and medication portions of their EMR.     ROS: Negative, with the exception of above mentioned in HPI   Objective:  BP 112/70   Pulse (!) 54   Temp 97.9 F (36.6 C) (Oral)   Ht 5' (1.524 m)   Wt 154 lb (69.9 kg)   LMP 08/08/2011   SpO2 100%   BMI 30.08 kg/m  Body mass index is 30.08 kg/m. Gen: Afebrile. No acute distress. Nontoxic in appearance, well developed, well nourished.  HENT: AT. [place]. Bilateral TM visualized without erythema or effusion. MMM, no oral lesions.  Bilateral nares without erythema, drainage or redness. Throat without erythema or exudates.  Very small amount of postnasal drip present.  No cough.  No hoarseness. Eyes:Pupils Equal Round Reactive to light, Extraocular movements intact,  Conjunctiva without redness, discharge or icterus. Neck/lymp/endocrine: Supple, no lymphadenopathy CV: RRR  Chest: CTAB, no wheeze or crackles. Good air movement, normal resp effort.  Neuro:Normal gait. PERLA. EOMi. Alert. Oriented x3    No results found. No results found. No results found for this or any previous visit (from the past 24 hour(s)).  Assessment/Plan: Rachael Nelson is a 60 y.o. female present for OV for  Chronic cough/Laryngopharyngeal reflux (LPR) Suspect her cough is from LPR.  She does endorse eating late at night more frequently. She is provided with information on LPR and dietary changes including laying flat after eating meal. Start omeprazole 40 mg, approximately 30 minutes before first meal the day.  She understands to take this medication for 2-4 weeks, if seeing improvement in symptoms she is to continue for 3 months and then attempt to taper off medication. If symptoms are not improved after 4 weeks or worsening she is to follow-up at that time.  And if symptoms return after 3 months of treatment and tapering back cough medication, she is to follow-up at that time to discuss chronic PPI therapy.  Seasonal allergies Suspect her cough is caused by LPR her for allergies by exam today.  She does have very small amount of postnasal drip, but overall her allergies seem to be well controlled on current regimen. Continue Xyzal in the evening Continue Flonase Start Atrovent nasal spray before bed, may use 4 times daily as needed for nasal drainage.  Reviewed expectations re: course of current medical issues. Discussed self-management of symptoms. Outlined signs and symptoms indicating need for more acute intervention. Patient verbalized  understanding and all questions were answered. Patient received an After-Visit Summary.    No orders of the defined types were placed in this encounter.  Meds ordered this encounter  Medications   omeprazole (PRILOSEC) 40 MG capsule    Sig: Take 1 capsule (40 mg total) by mouth daily.    Dispense:  30 capsule    Refill:  3   ipratropium (ATROVENT) 0.06 % nasal spray    Sig: Place 2 sprays into both nostrils 4 (four) times daily.    Dispense:  15 mL    Refill:  12   Referral Orders  No referral(s) requested today     Note is dictated utilizing voice recognition software. Although note has been proof read prior to signing, occasional typographical errors still can be missed.  If any questions arise, please do not hesitate to call for verification.   electronically signed by:  Howard Pouch, DO  [place]

## 2021-04-07 NOTE — Patient Instructions (Signed)
Start omeprazole about 30 min. Before first meal of day. Use for 4 weeks, if improving, continue for 3 months, then try to discontinue.   If not improving or symptoms return after 3 mos, follow up at that time.    Atrovent nasal spray before bed, can use up to 4x a day.    Food Choices for Gastroesophageal Reflux Disease, Adult When you have gastroesophageal reflux disease (GERD), the foods you eat and your eating habits are very important. Choosing the right foods can help ease your discomfort. Think about working with a food expert (dietitian) to help you make good choices. What are tips for following this plan? Reading food labels Look for foods that are low in saturated fat. Foods that may help with your symptoms include: Foods that have less than 5% of daily value (DV) of fat. Foods that have 0 grams of trans fat. Cooking Do not fry your food. Cook your food by baking, steaming, grilling, or broiling. These are all methods that do not need a lot of fat for cooking. To add flavor, try to use herbs that are low in spice and acidity. Meal planning  Choose healthy foods that are low in fat, such as: Fruits and vegetables. Whole grains. Low-fat dairy products. Lean meats, fish, and poultry. Eat small meals often instead of eating 3 large meals each day. Eat your meals slowly in a place where you are relaxed. Avoid bending over or lying down until 2-3 hours after eating. Limit high-fat foods such as fatty meats or fried foods. Limit your intake of fatty foods, such as oils, butter, and shortening. Avoid the following as told by your doctor: Foods that cause symptoms. These may be different for different people. Keep a food diary to keep track of foods that cause symptoms. Alcohol. Drinking a lot of liquid with meals. Eating meals during the 2-3 hours before bed.  Lifestyle Stay at a healthy weight. Ask your doctor what weight is healthy for you. If you need to lose weight, work  with your doctor to do so safely. Exercise for at least 30 minutes on 5 or more days each week, or as told by your doctor. Wear loose-fitting clothes. Do not smoke or use any products that contain nicotine or tobacco. If you need help quitting, ask your doctor. Sleep with the head of your bed higher than your feet. Use a wedge under the mattress or blocks under the bed frame to raise the head of the bed. Chew sugar-free gum after meals. What foods should eat?  Eat a healthy, well-balanced diet of fruits, vegetables, whole grains, low-fatdairy products, lean meats, fish, and poultry. Each person is different. Foods that may cause symptoms in one person may not cause any symptoms inanother person. Work with your doctor to find foods that are safe for you. The items listed above may not be a complete list of what you can eat and drink. Contact a food expert for more options. What foods should I avoid? Limiting some of these foods may help in managing the symptoms of GERD. Everyone is different. Talk with a food expert or your doctor to help you findthe exact foods to avoid, if any. Fruits Any fruits prepared with added fat. Any fruits that cause symptoms. For some people, this may include citrus fruits, such as oranges, grapefruit, pineapple,and lemons. Vegetables Deep-fried vegetables. [place] fries. Any vegetables prepared with added fat. Any vegetables that cause symptoms. For some people, this may include tomatoesand tomato products,  chili peppers, onions and garlic, and horseradish. Grains Pastries or quick breads with added fat. Meats and other proteins High-fat meats, such as fatty beef or pork, hot dogs, ribs, ham, sausage, salami, and bacon. Fried meat or protein, including fried fish and friedchicken. Nuts and nut butters, in large amounts. Dairy Whole milk and chocolate milk. Sour cream. Cream. Ice cream. Cream cheese.Milkshakes. Fats and oils Butter. Margarine. Shortening.  Ghee. Beverages Coffee and tea, with or without caffeine. Carbonated beverages. Sodas. Energy drinks. Fruit juice made with acidic fruits, such as orange or grapefruit.Tomato juice. Alcoholic drinks. Sweets and desserts Chocolate and cocoa. Donuts. Seasonings and condiments Pepper. Peppermint and spearmint. Added salt. Any condiments, herbs, or seasonings that cause symptoms. For some people, this may include curry, hotsauce, or vinegar-based salad dressings. The items listed above may not be a complete list of what you should not eat and drink. Contact a food expert for more options. Questions to ask your doctor Diet and lifestyle changes are often the first steps that are taken to manage symptoms of GERD. If diet and lifestyle changes do not help, talk with yourdoctor about taking medicines. Where to find more information International Foundation for Gastrointestinal Disorders: aboutgerd.org Summary When you have GERD, food and lifestyle choices are very important in easing your symptoms. Eat small meals often instead of 3 large meals a day. Eat your meals slowly and in a place where you are relaxed. Avoid bending over or lying down until 2-3 hours after eating. Limit high-fat foods such as fatty meats or fried foods. This information is not intended to replace advice given to you by your health care provider. Make sure you discuss any questions you have with your healthcare provider. Document Revised: 04/13/2020 Document Reviewed: 04/13/2020 Elsevier Patient Education  2022 [employer].

## 2021-06-28 ENCOUNTER — Other Ambulatory Visit: Payer: Self-pay | Admitting: Family Medicine

## 2021-12-10 ENCOUNTER — Other Ambulatory Visit: Payer: Self-pay

## 2021-12-10 ENCOUNTER — Telehealth: Payer: Self-pay

## 2021-12-10 ENCOUNTER — Encounter: Payer: Self-pay | Admitting: Family Medicine

## 2021-12-10 ENCOUNTER — Ambulatory Visit (INDEPENDENT_AMBULATORY_CARE_PROVIDER_SITE_OTHER): Payer: BC Managed Care – PPO | Admitting: Family Medicine

## 2021-12-10 VITALS — BP 116/71 | HR 51 | Temp 98.2°F | Ht 60.0 in | Wt 164.0 lb

## 2021-12-10 DIAGNOSIS — E669 Obesity, unspecified: Secondary | ICD-10-CM | POA: Diagnosis not present

## 2021-12-10 DIAGNOSIS — Z131 Encounter for screening for diabetes mellitus: Secondary | ICD-10-CM | POA: Diagnosis not present

## 2021-12-10 DIAGNOSIS — Z130 Encounter for screening for diseases of the blood and blood-forming organs and certain disorders involving the immune mechanism: Secondary | ICD-10-CM

## 2021-12-10 DIAGNOSIS — Z0000 Encounter for general adult medical examination without abnormal findings: Secondary | ICD-10-CM | POA: Diagnosis not present

## 2021-12-10 LAB — LIPID PANEL
Cholesterol: 205 mg/dL — ABNORMAL HIGH (ref 0–200)
HDL: 79.5 mg/dL (ref >39.00)
LDL Cholesterol: 113 mg/dL — ABNORMAL HIGH (ref 0–99)
NonHDL: 125.46
Total CHOL/HDL Ratio: 3
Triglycerides: 61 mg/dL (ref 0.0–149.0)
VLDL: 12.2 mg/dL (ref 0.0–40.0)

## 2021-12-10 LAB — COMPREHENSIVE METABOLIC PANEL
ALT: 16 U/L (ref 0–35)
AST: 20 U/L (ref 0–37)
Albumin: 4 g/dL (ref 3.5–5.2)
Alkaline Phosphatase: 53 U/L (ref 39–117)
BUN: 19 mg/dL (ref 6–23)
CO2: 33 mEq/L — ABNORMAL HIGH (ref 19–32)
Calcium: 9.6 mg/dL (ref 8.4–10.5)
Chloride: 104 mEq/L (ref 96–112)
Creatinine, Ser: 0.68 mg/dL (ref 0.40–1.20)
GFR: 94.83 mL/min (ref >60.00)
Glucose, Bld: 82 mg/dL (ref 70–99)
Potassium: 4 mEq/L (ref 3.5–5.1)
Sodium: 140 mEq/L (ref 135–145)
Total Bilirubin: 0.6 mg/dL (ref 0.2–1.2)
Total Protein: 6.6 g/dL (ref 6.0–8.3)

## 2021-12-10 LAB — CBC
HCT: 42 % (ref 36.0–46.0)
Hemoglobin: 13.8 g/dL (ref 12.0–15.0)
MCHC: 32.9 g/dL (ref 30.0–36.0)
MCV: 89.2 fl (ref 78.0–100.0)
Platelets: 247 10*3/uL (ref 150.0–400.0)
RBC: 4.72 Mil/uL (ref 3.87–5.11)
RDW: 13.6 % (ref 11.5–15.5)
WBC: 4.7 10*3/uL (ref 4.0–10.5)

## 2021-12-10 LAB — HEMOGLOBIN A1C: Hgb A1c MFr Bld: 5.5 % (ref 4.6–6.5)

## 2021-12-10 NOTE — Patient Instructions (Signed)

## 2021-12-10 NOTE — Telephone Encounter (Signed)
error 

## 2021-12-10 NOTE — Progress Notes (Signed)
This visit occurred during the SARS-CoV-2 public health emergency.  Safety protocols were in place, including screening questions prior to the visit, additional usage of staff PPE, and extensive cleaning of exam room while observing appropriate contact time as indicated for disinfecting solutions.    Patient ID: Rachael Nelson, female  DOB: 07/09/1961, 61 y.o.   MRN: [id number] Patient Care Team    Relationship Specialty Notifications Start End  Natalia Leatherwood, DO PCP - General Family Medicine  05/17/19   Zelphia Cairo, MD Consulting Physician Obstetrics and Gynecology  05/17/19     Chief Complaint  Patient presents with   Annual Exam    Pt is fasting    Subjective: Rachael Nelson is a 61 y.o.  Female  present for CPE. All past medical history, surgical history, allergies, family history, immunizations, medications and social history were updated in the electronic medical record today. All recent labs, ED visits and hospitalizations within the last year were reviewed.  Health maintenance:  Colonoscopy: completed 2018, by ? , resutls normal. follow up 10. Mammogram: completed:05/2020, PFW> she will call to schedule.  Cervical cancer screening: last pap: 02/2019, PFW Immunizations: tdap UTD 06/2019, Influenza UTD declined (encouraged yearly), shingrix completed, covid completed, covid completed Infectious disease screening: HIV and Hep C completed DEXA: declined Assistive device: none Oxygen IBB:CWUG Patient has a Dental home. Hospitalizations/ED visits: reviewed  Depression screen [facility] 2/9 12/10/2021 12/09/2020 12/09/2020 05/17/2019  Decreased Interest 0 0 0 0  Down, Depressed, Hopeless 0 0 0 0  PHQ - 2 Score 0 0 0 0   No flowsheet data found.   Immunization History  Administered Date(s) Administered   Influenza,inj,Quad PF,6+ Mos 08/06/2019   Influenza-Unspecified 10/16/2020   PFIZER(Purple Top)SARS-COV-2 Vaccination 01/06/2020, 01/29/2020, 10/16/2020   Tdap 06/25/2019   Zoster  Recombinat (Shingrix) 05/17/2019, 08/23/2019   Past Medical History:  Diagnosis Date   Asthma    Chicken pox    COVID-19 07/2020   has leftover fatigue   Exercise-induced asthma    No Known Allergies Past Surgical History:  Procedure Laterality Date   BREAST BIOPSY Left 2016   benign   CESAREAN SECTION  1999   CHOLECYSTECTOMY  1996   WISDOM TOOTH EXTRACTION  1984   Family History  Problem Relation Age of Onset   Arthritis Mother    Diabetes Mother    Hypertension Mother    Heart disease Father    Hypertension Father    Diabetes Maternal Grandmother    Stroke Maternal Grandmother    Heart disease Maternal Grandfather    Stroke Paternal Grandmother    Early death Paternal Grandfather    Social History   Social History Narrative   Marital status/children/pets: Divorced.  Has twin sons in college.   Education/employment: Masters degree, works as a Research officer, political party:      -Wears a bicycle helmet riding a bike: Yes     -smoke alarm in the home:Yes     - wears seatbelt: Yes       Allergies as of 12/10/2021   No Known Allergies      Medication List        Accurate as of December 10, 2021  8:50 AM. If you have any questions, ask your nurse or doctor.          STOP taking these medications    ipratropium 0.06 % nasal spray Commonly known as: ATROVENT Stopped by: Felix Pacini, DO   omeprazole 40 MG capsule Commonly known  as: PRILOSEC Stopped by: Felix Pacini, DO       TAKE these medications    glucosamine-chondroitin 500-400 MG tablet Take 1 tablet by mouth 3 (three) times daily.   multivitamins ther. w/minerals Tabs tablet Take 1 tablet by mouth daily.   VITAMIN D (CHOLECALCIFEROL) PO Take 1 tablet by mouth daily.        All past medical history, surgical history, allergies, family history, immunizations andmedications were updated in the EMR today and reviewed under the history and medication portions of their EMR.     No results  found for this or any previous visit (from the past 2160 hour(s)).   ROS 14 pt review of systems performed and negative (unless mentioned in an HPI)  Objective:  BP 116/71    Pulse (!) 51    Temp 98.2 F (36.8 C) (Oral)    Ht 5' (1.524 m)    Wt 164 lb (74.4 kg)    LMP 08/08/2011    SpO2 98%    BMI 32.03 kg/m  Physical Exam Vitals and nursing note reviewed.  Constitutional:      General: She is not in acute distress.    Appearance: Normal appearance. She is obese. She is not ill-appearing or toxic-appearing.  HENT:     Head: Normocephalic and atraumatic.     Right Ear: Tympanic membrane, ear canal and external ear normal. There is no impacted cerumen.     Left Ear: Tympanic membrane, ear canal and external ear normal. There is no impacted cerumen.     Nose: No congestion or rhinorrhea.     Mouth/Throat:     Mouth: Mucous membranes are moist.     Pharynx: Oropharynx is clear. No oropharyngeal exudate or posterior oropharyngeal erythema.  Eyes:     General: No scleral icterus.       Right eye: No discharge.        Left eye: No discharge.     Extraocular Movements: Extraocular movements intact.     Conjunctiva/sclera: Conjunctivae normal.     Pupils: Pupils are equal, round, and reactive to light.  Cardiovascular:     Rate and Rhythm: Normal rate and regular rhythm.     Pulses: Normal pulses.     Heart sounds: Normal heart sounds. No murmur heard.   No friction rub. No gallop.  Pulmonary:     Effort: Pulmonary effort is normal. No respiratory distress.     Breath sounds: Normal breath sounds. No stridor. No wheezing, rhonchi or rales.  Chest:     Chest wall: No tenderness.  Abdominal:     General: Abdomen is flat. Bowel sounds are normal. There is no distension.     Palpations: Abdomen is soft. There is no mass.     Tenderness: There is no abdominal tenderness. There is no right CVA tenderness, left CVA tenderness, guarding or rebound.     Hernia: No hernia is present.   Musculoskeletal:        General: No swelling, tenderness or deformity. Normal range of motion.     Cervical back: Normal range of motion and neck supple. No rigidity or tenderness.     Right lower leg: No edema.     Left lower leg: No edema.  Lymphadenopathy:     Cervical: No cervical adenopathy.  Skin:    General: Skin is warm and dry.     Coloration: Skin is not jaundiced or pale.     Findings: No bruising, erythema, lesion or rash.  Neurological:     General: No focal deficit present.     Mental Status: She is alert and oriented to person, place, and time. Mental status is at baseline.     Cranial Nerves: No cranial nerve deficit.     Sensory: No sensory deficit.     Motor: No weakness.     Coordination: Coordination normal.     Gait: Gait normal.     Deep Tendon Reflexes: Reflexes normal.  Psychiatric:        Mood and Affect: Mood normal.        Behavior: Behavior normal.        Thought Content: Thought content normal.        Judgment: Judgment normal.     normal uterus.  No bladder/suprapubic fullness, masses or tenderness. No cervical motion tenderness. No adnexal fullness. Anus and perineum within normal limits, no lesions.  No results found.  Assessment/plan: Rachael Nelson is a 61 y.o. female present for CPE  Obesity (BMI 30-39.9) - Lipid panel Diabetes mellitus screening - Comprehensive metabolic panel - Hemoglobin A1c Screening for deficiency anemia - CBC Routine general medical examination at a health care facility Colonoscopy: completed 2018, by ? , resutls normal. follow up 10. Mammogram: completed:05/2020, PFW> she will call to schedule.  Cervical cancer screening: last pap: 02/2019, PFW Immunizations: tdap UTD 06/2019, Influenza UTD declined (encouraged yearly), shingrix completed, covid completed, covid completed Infectious disease screening: HIV and Hep C completed DEXA: declined Patient was encouraged to exercise greater than 150 minutes a week. Patient was  encouraged to choose a diet filled with fresh fruits and vegetables, and lean meats. AVS provided to patient today for education/recommendation on gender specific health and safety maintenance.  Return in about 1 year (around 12/11/2022) for CPE (30 min).  Orders Placed This Encounter  Procedures   CBC   Comprehensive metabolic panel   Hemoglobin A1c   Lipid panel    Orders Placed This Encounter  Procedures   CBC   Comprehensive metabolic panel   Hemoglobin A1c   Lipid panel   No orders of the defined types were placed in this encounter.  Referral Orders  No referral(s) requested today     Electronically signed by: Felix Pacini, DO  Primary [facility]- [place]

## 2022-03-24 DIAGNOSIS — Z01419 Encounter for gynecological examination (general) (routine) without abnormal findings: Secondary | ICD-10-CM | POA: Diagnosis not present

## 2022-03-24 DIAGNOSIS — Z6832 Body mass index (BMI) 32.0-32.9, adult: Secondary | ICD-10-CM | POA: Diagnosis not present

## 2022-03-24 DIAGNOSIS — Z124 Encounter for screening for malignant neoplasm of cervix: Secondary | ICD-10-CM | POA: Diagnosis not present

## 2022-03-24 DIAGNOSIS — N952 Postmenopausal atrophic vaginitis: Secondary | ICD-10-CM | POA: Diagnosis not present

## 2022-03-24 DIAGNOSIS — Z1151 Encounter for screening for human papillomavirus (HPV): Secondary | ICD-10-CM | POA: Diagnosis not present

## 2022-03-24 LAB — HM PAP SMEAR

## 2022-03-24 LAB — RESULTS CONSOLE HPV: CHL HPV: NEGATIVE

## 2022-04-20 DIAGNOSIS — Z1231 Encounter for screening mammogram for malignant neoplasm of breast: Secondary | ICD-10-CM | POA: Diagnosis not present

## 2022-04-20 DIAGNOSIS — Z13820 Encounter for screening for osteoporosis: Secondary | ICD-10-CM | POA: Diagnosis not present

## 2022-04-20 LAB — HM DEXA SCAN

## 2022-04-20 LAB — HM MAMMOGRAPHY

## 2024-01-29 ENCOUNTER — Ambulatory Visit: Payer: Self-pay

## 2024-01-29 NOTE — Telephone Encounter (Signed)
 Chief Complaint: Left hip pain Symptoms: Hip pain that radiates down leg Frequency: since November, worsening Pertinent Negatives: Patient denies numbness/tingling in leg, rash, fever Disposition: [] ED /[] Urgent Care (no appt availability in office) / [x] Appointment(In office/virtual)/ []  [place] [facility]/ [] Home Care/ [] Refused Recommended Disposition /[] [place] [facility]/ []  Follow-up with PCP Additional Notes: Patient called to make appt for evaluation of left hip, stating she has had intermittent pain since November that is worsening. Patient states she experiences it worse with walking, standing for extended periods of time, sleeping on left side. Patient advised to treat with ibuprofen and heating pad, and to do stretches for sciatica before appointment. Appt made for further evaluation.    Copied from CRM [id number]. Topic: Appointments - Appointment Scheduling >> Jan 29, 2024 11:46 AM Clydene Darner H wrote: Patient called regarding physical assessment for left hip pain. She reports no pain while taking cycle classes, but experiences pain when walking, cooking, or performing other activities. She states that the pain is starting to get worse. Callback number [phone] Reason for Disposition  [1] MODERATE pain (e.g., interferes with normal activities, limping) AND [2] present > 3 days  Answer Assessment - Initial Assessment Questions 1. LOCATION and RADIATION: "Where is the pain located?"      Starts up at hip and down left leg 2. QUALITY: "What does the pain feel like?"  (e.g., sharp, dull, aching, burning)     Stabbing  3. SEVERITY: "How bad is the pain?" "What does it keep you from doing?"   (Scale 1-10; or mild, moderate, severe)   -  MILD (1-3): doesn't interfere with normal activities    -  MODERATE (4-7): interferes with normal activities (e.g., work or school) or awakens from sleep, limping    -  SEVERE (8-10): excruciating pain, unable to do any normal activities, unable  to walk     Ranges with position and activity 4. ONSET: "When did the pain start?" "Does it come and go, or is it there all the time?"     Comes and goes and worsened with walking - started in November and worsening 5. WORK OR EXERCISE: "Has there been any recent work or exercise that involved this part of the body?"      Walking, standing for periods of time 6. CAUSE: "What do you think is causing the hip pain?"      Cause is unknown 7. AGGRAVATING FACTORS: "What makes the hip pain worse?" (e.g., walking, climbing stairs, running)     Walking and standing make it worse 8. OTHER SYMPTOMS: "Do you have any other symptoms?" (e.g., back pain, pain shooting down leg,  fever, rash)     No  Protocols used: Hip Pain-A-AH

## 2024-01-30 ENCOUNTER — Encounter: Payer: Self-pay | Admitting: Family Medicine

## 2024-01-30 ENCOUNTER — Ambulatory Visit (INDEPENDENT_AMBULATORY_CARE_PROVIDER_SITE_OTHER): Admitting: Family Medicine

## 2024-01-30 VITALS — BP 118/74 | HR 66 | Temp 98.0°F | Wt 180.0 lb

## 2024-01-30 DIAGNOSIS — M5416 Radiculopathy, lumbar region: Secondary | ICD-10-CM

## 2024-01-30 MED ORDER — TIZANIDINE HCL 4 MG PO TABS: 2.0000 mg | ORAL_TABLET | Freq: Three times a day (TID) | ORAL | 0 refills | Status: AC | PRN

## 2024-01-30 MED ORDER — MELOXICAM 15 MG PO TABS: 15.0000 mg | ORAL_TABLET | Freq: Every day | ORAL | 0 refills | Status: AC

## 2024-01-30 NOTE — Patient Instructions (Addendum)
 Xray at [facility]. On 66  Radicular Pain Radicular pain is a type of pain that spreads from your back or neck along a spinal nerve. Spinal nerves are nerves that leave the spinal cord and go to the muscles. Radicular pain is sometimes called radiculopathy, radiculitis, or a pinched nerve. When you have this type of pain, you may also have weakness, numbness, or tingling in the area of your body that is supplied by the nerve. The pain may feel sharp and burning. Depending on which spinal nerve is affected, the pain may occur in the: Neck area (cervical radicular pain). You may also feel pain, numbness, weakness, or tingling in the arms. Mid-spine area (thoracic radicular pain). You would feel this pain in the back and chest. This type is rare. Lower back area (lumbar radicular pain). You would feel this pain as low back pain. You may feel pain, numbness, weakness, or tingling in the buttocks or legs. Sciatica is a type of lumbar radicular pain that shoots down the back of the leg. Radicular pain occurs when one of the spinal nerves becomes irritated or squeezed (compressed). It is often caused by something pushing on a spinal nerve, such as one of the bones of the spine (vertebrae) or one of the round cushions between vertebrae (intervertebral disks). This can result from: An injury. Wear and tear or aging of a disk. The growth of a bone spur that pushes on the nerve. Radicular pain often goes away when you follow instructions from your health care provider for relieving pain at home. How is this treated? Treatment may depend on the cause of the condition and may include: Working with a physical therapist. Taking pain medicine. Applying heat or ice or both to the affected areas. Doing stretches to improve flexibility. Having surgery. This may be needed if other treatments do not help. Different types of surgery may be done depending on the cause of this condition. Follow these  instructions at home: Managing pain     If directed, put ice on the affected area. To do this: Put ice in a plastic bag. Place a towel between your skin and the bag. Leave the ice on for 20 minutes, 2-3 times a day. Remove the ice if your skin turns bright red. This is very important. If you cannot feel pain, heat, or cold, you have a greater risk of damage to the area. If directed, apply heat to the affected area as often as told by your health care provider. Use the heat source that your health care provider recommends, such as a moist heat pack or a heating pad. Place a towel between your skin and the heat source. Leave the heat on for 20-30 minutes. Remove the heat if your skin turns bright red. This is especially important if you are unable to feel pain, heat, or cold. You have a greater risk of getting burned. Activity Do not sit or rest in bed for long periods of time. Try to stay as active as possible. Ask your health care provider what type of exercise or activity is best for you. Avoid activities that make your pain worse, such as bending and lifting. You may have to avoid lifting. Ask your health care provider how much you can safely lift. Practice using proper technique when lifting items. Proper lifting technique involves bending your knees and rising up. Do strength and range-of-motion exercises only as told by your health care provider or physical therapist. General instructions Take over-the-counter and  prescription medicines only as told by your health care provider. Pay attention to any changes in your symptoms. Keep all follow-up visits. This is important. Contact a health care provider if: Your pain and other symptoms get worse. Your pain medicine is not helping. Your pain has not improved after a few weeks of home care. You have a fever. Get help right away if: You have severe pain, weakness, or numbness. You have difficulty with bladder or bowel  control. Summary Radicular pain is a type of pain that spreads from your back or neck along a spinal nerve. When you have radicular pain, you may also have weakness, numbness, or tingling in the area of your body that is supplied by the nerve. The pain may feel sharp or burning. Radicular pain may be treated with ice, heat, medicines, or physical therapy. This information is not intended to replace advice given to you by your health care provider. Make sure you discuss any questions you have with your health care provider. Document Revised: 04/08/2021 Document Reviewed: 04/08/2021 Elsevier Patient Education  2024 [employer].

## 2024-01-30 NOTE — Progress Notes (Signed)
 Rachael Nelson , 14-Jul-1961, 63 y.o., female MRN: [id number] Patient Care Team    Relationship Specialty Notifications Start End  Natalia Leatherwood, DO PCP - General Family Medicine  05/17/19   Zelphia Cairo, MD Consulting Physician Obstetrics and Gynecology  05/17/19     Chief Complaint  Patient presents with   Hip Pain    6 months, L Hip & down L leg. Pt has taken Aleve.      Subjective: Rachael Nelson is a 63 y.o. Pt presents for an OV with complaints of left lower back discomfort that radiates around her hip and down her anterior thigh of 6 months duration.  Associated symptoms include she states initially it was uncomfortable, but now she is having trouble walking for exercise.  She is still able to cycle but after walking even 30 minutes she has significant pain.  She has tried Aleve for her discomfort which does seem to relieve the area.  She denies any injury, change in activity or prior surgical procedures of lower lumbar spine..      12/10/2021    8:35 AM 12/09/2020    8:41 AM 12/09/2020    8:34 AM 05/17/2019    2:15 PM  Depression screen PHQ 2/9  Decreased Interest 0 0 0 0  Down, Depressed, Hopeless 0 0 0 0  PHQ - 2 Score 0 0 0 0    No Known Allergies Social History   Social History Narrative   Marital status/children/pets: Divorced.  Has twin sons in college.   Education/employment: Masters degree, works as a Research officer, political party:      -Wears a bicycle helmet riding a bike: Yes     -smoke alarm in the home:Yes     - wears seatbelt: Yes      Past Medical History:  Diagnosis Date   Asthma    Chicken pox    COVID-19 07/2020   has leftover fatigue   Exercise-induced asthma    Past Surgical History:  Procedure Laterality Date   BREAST BIOPSY Left 2016   benign   CESAREAN SECTION  1999   CHOLECYSTECTOMY  1996   WISDOM TOOTH EXTRACTION  1984   Family History  Problem Relation Age of Onset   Arthritis Mother    Diabetes Mother    Hypertension Mother     Heart disease Father    Hypertension Father    Diabetes Maternal Grandmother    Stroke Maternal Grandmother    Heart disease Maternal Grandfather    Stroke Paternal Grandmother    Early death Paternal Grandfather    Allergies as of 01/30/2024   No Known Allergies      Medication List        Accurate as of January 30, 2024  3:13 PM. If you have any questions, ask your nurse or doctor.          glucosamine-chondroitin 500-400 MG tablet Take 1 tablet by mouth 3 (three) times daily.   meloxicam 15 MG tablet Commonly known as: MOBIC Take 1 tablet (15 mg total) by mouth daily. Started by: Felix Pacini   multivitamins ther. w/minerals Tabs tablet Take 1 tablet by mouth daily.   tiZANidine 4 MG tablet Commonly known as: Zanaflex Take 0.5-1 tablets (2-4 mg total) by mouth every 8 (eight) hours as needed for muscle spasms. Started by: Felix Pacini   VITAMIN D (CHOLECALCIFEROL) PO Take 1 tablet by mouth daily.        All  past medical history, surgical history, allergies, family history, immunizations andmedications were updated in the EMR today and reviewed under the history and medication portions of their EMR.     ROS Negative, with the exception of above mentioned in HPI   Objective:  BP 118/74   Pulse 66   Temp 98 F (36.7 C)   Wt 180 lb (81.6 kg)   LMP 08/08/2011   SpO2 97%   BMI 35.15 kg/m  Body mass index is 35.15 kg/m. Physical Exam Vitals and nursing note reviewed.  Constitutional:      General: She is not in acute distress.    Appearance: Normal appearance. She is normal weight. She is not ill-appearing or toxic-appearing.  HENT:     Head: Normocephalic and atraumatic.  Eyes:     General: No scleral icterus.       Right eye: No discharge.        Left eye: No discharge.     Extraocular Movements: Extraocular movements intact.     Conjunctiva/sclera: Conjunctivae normal.     Pupils: Pupils are equal, round, and reactive to light.   Musculoskeletal:     Lumbar back: Spasms and tenderness present. No bony tenderness. Decreased range of motion. Negative right straight leg raise test and negative left straight leg raise test.       Back:  Skin:    Findings: No rash.  Neurological:     Mental Status: She is alert and oriented to person, place, and time. Mental status is at baseline.     Motor: No weakness.     Coordination: Coordination normal.     Gait: Gait normal.  Psychiatric:        Mood and Affect: Mood normal.        Behavior: Behavior normal.        Thought Content: Thought content normal.        Judgment: Judgment normal.      No results found. No results found. No results found for this or any previous visit (from the past 24 hours).  Assessment/Plan: Rachael Nelson is a 63 y.o. female present for OV for  Lumbar radiculopathy (Primary) We discussed differential diagnosis today.  Her discomfort seems to be radiating along dermatome pattern suggesting lumbar etiology.  It is worse with walking.   Hip exam is normal.  Reproducible symptoms with range of motion of the lumbar spine. Start Mobic 15 mg daily with food for 14 days. Start Zanaflex 2-4 mg nightly scheduled, can take additional dose throughout the day if needed and it does not cause her sedation. Will obtain x-ray of the lumbar spine since discomfort has been present for 6 months. Placed her for physical therapy to start, if x-ray does not reveal indication to not start physical therapy. - DG Lumbar Spine Complete; Future - Ambulatory referral to Physical Therapy Patient will be called with results.  Follow-up in 4 weeks if not seeing improvement in symptoms, sooner if worsening  Reviewed expectations re: course of current medical issues. Discussed self-management of symptoms. Outlined signs and symptoms indicating need for more acute intervention. Patient verbalized understanding and all questions were answered. Patient received an After-Visit  Summary.    Orders Placed This Encounter  Procedures   DG Lumbar Spine Complete   Ambulatory referral to Physical Therapy   Meds ordered this encounter  Medications   meloxicam (MOBIC) 15 MG tablet    Sig: Take 1 tablet (15 mg total) by mouth daily.  Dispense:  30 tablet    Refill:  0   tiZANidine (ZANAFLEX) 4 MG tablet    Sig: Take 0.5-1 tablets (2-4 mg total) by mouth every 8 (eight) hours as needed for muscle spasms.    Dispense:  90 tablet    Refill:  0   Referral Orders         Ambulatory referral to Physical Therapy       Note is dictated utilizing voice recognition software. Although note has been proof read prior to signing, occasional typographical errors still can be missed. If any questions arise, please do not hesitate to call for verification.   electronically signed by:  Napolean Backbone, DO  [facility] [place]

## 2024-02-01 ENCOUNTER — Ambulatory Visit

## 2024-02-01 DIAGNOSIS — M47816 Spondylosis without myelopathy or radiculopathy, lumbar region: Secondary | ICD-10-CM | POA: Diagnosis not present

## 2024-02-01 DIAGNOSIS — M5416 Radiculopathy, lumbar region: Secondary | ICD-10-CM | POA: Diagnosis not present

## 2024-02-01 DIAGNOSIS — M419 Scoliosis, unspecified: Secondary | ICD-10-CM | POA: Diagnosis not present

## 2024-02-01 DIAGNOSIS — M545 Low back pain, unspecified: Secondary | ICD-10-CM | POA: Diagnosis not present

## 2024-02-15 ENCOUNTER — Encounter: Payer: Self-pay | Admitting: Family Medicine

## 2024-02-15 ENCOUNTER — Telehealth: Payer: Self-pay | Admitting: Family Medicine

## 2024-02-15 NOTE — Telephone Encounter (Signed)
 Please call radiology and have image read, completed > 2 weeks ago.   FYI: this is 1:3 in inbasket sent for radiology to read.

## 2024-02-15 NOTE — Telephone Encounter (Signed)
 Reading room called and advised that it has been completed

## 2024-02-27 ENCOUNTER — Ambulatory Visit: Admitting: Family Medicine

## 2024-02-28 DIAGNOSIS — M5416 Radiculopathy, lumbar region: Secondary | ICD-10-CM | POA: Diagnosis not present

## 2024-03-15 DIAGNOSIS — M5416 Radiculopathy, lumbar region: Secondary | ICD-10-CM | POA: Diagnosis not present

## 2024-05-09 DIAGNOSIS — M5416 Radiculopathy, lumbar region: Secondary | ICD-10-CM | POA: Diagnosis not present

## 2024-05-30 DIAGNOSIS — M5416 Radiculopathy, lumbar region: Secondary | ICD-10-CM | POA: Diagnosis not present

## 2024-06-27 DIAGNOSIS — M5416 Radiculopathy, lumbar region: Secondary | ICD-10-CM | POA: Diagnosis not present
